# Patient Record
Sex: Female | Born: 1985 | Hispanic: Yes | Marital: Married | Smoking: Never smoker
Health system: Southern US, Community
[De-identification: ages and names within clinical notes are randomized; demographics above are authoritative.]

## PROBLEM LIST (undated history)

## (undated) ENCOUNTER — Inpatient Hospital Stay (HOSPITAL_COMMUNITY): Payer: Self-pay

## (undated) DIAGNOSIS — Z9189 Other specified personal risk factors, not elsewhere classified: Secondary | ICD-10-CM

## (undated) DIAGNOSIS — Z8619 Personal history of other infectious and parasitic diseases: Secondary | ICD-10-CM

## (undated) DIAGNOSIS — G43909 Migraine, unspecified, not intractable, without status migrainosus: Secondary | ICD-10-CM

## (undated) HISTORY — DX: Other specified personal risk factors, not elsewhere classified: Z91.89

## (undated) HISTORY — DX: Personal history of other infectious and parasitic diseases: Z86.19

## (undated) HISTORY — DX: Migraine, unspecified, not intractable, without status migrainosus: G43.909

---

## 2007-10-30 ENCOUNTER — Ambulatory Visit: Payer: Self-pay | Admitting: Family Medicine

## 2008-03-26 ENCOUNTER — Emergency Department: Payer: Self-pay | Admitting: Emergency Medicine

## 2008-07-07 ENCOUNTER — Emergency Department: Payer: Self-pay | Admitting: Emergency Medicine

## 2008-08-07 ENCOUNTER — Emergency Department: Payer: Self-pay | Admitting: Emergency Medicine

## 2009-10-15 ENCOUNTER — Emergency Department: Payer: Self-pay | Admitting: Emergency Medicine

## 2011-11-16 LAB — OB RESULTS CONSOLE HEPATITIS B SURFACE ANTIGEN: Hepatitis B Surface Ag: NEGATIVE

## 2011-11-16 LAB — OB RESULTS CONSOLE RPR: RPR: NONREACTIVE

## 2011-11-16 LAB — OB RESULTS CONSOLE ANTIBODY SCREEN: Antibody Screen: NEGATIVE

## 2011-11-16 LAB — OB RESULTS CONSOLE RUBELLA ANTIBODY, IGM: Rubella: IMMUNE

## 2011-11-16 LAB — OB RESULTS CONSOLE HIV ANTIBODY (ROUTINE TESTING): HIV: NONREACTIVE

## 2011-11-23 ENCOUNTER — Inpatient Hospital Stay (HOSPITAL_COMMUNITY)
Admission: AD | Admit: 2011-11-23 | Discharge: 2011-11-24 | Disposition: A | Discharge status: Hospice | Payer: BC Managed Care – PPO | Source: Ambulatory Visit | Attending: Obstetrics & Gynecology | Admitting: Obstetrics & Gynecology

## 2011-11-23 ENCOUNTER — Inpatient Hospital Stay (HOSPITAL_COMMUNITY): Payer: BC Managed Care – PPO

## 2011-11-23 ENCOUNTER — Encounter (HOSPITAL_COMMUNITY): Payer: Self-pay | Admitting: *Deleted

## 2011-11-23 DIAGNOSIS — O209 Hemorrhage in early pregnancy, unspecified: Secondary | ICD-10-CM

## 2011-11-23 LAB — URINALYSIS, ROUTINE W REFLEX MICROSCOPIC
Bilirubin Urine: NEGATIVE
Glucose, UA: NEGATIVE mg/dL
Ketones, ur: NEGATIVE mg/dL
Leukocytes, UA: NEGATIVE
Nitrite: NEGATIVE
Protein, ur: NEGATIVE mg/dL
Specific Gravity, Urine: <1.005 — ABNORMAL LOW (ref 1.005–1.030)
Urobilinogen, UA: 0.2 mg/dL (ref 0.0–1.0)
pH: 5.5 (ref 5.0–8.0)

## 2011-11-23 LAB — CBC
HCT: 35.6 % — ABNORMAL LOW (ref 36.0–46.0)
Hemoglobin: 12 g/dL (ref 12.0–15.0)
MCH: 28.6 pg (ref 26.0–34.0)
MCHC: 33.7 g/dL (ref 30.0–36.0)
MCV: 85 fL (ref 78.0–100.0)
Platelets: 178 10*3/uL (ref 150–400)
RBC: 4.19 MIL/uL (ref 3.87–5.11)
RDW: 12.9 % (ref 11.5–15.5)
WBC: 11.8 10*3/uL — ABNORMAL HIGH (ref 4.0–10.5)

## 2011-11-23 LAB — URINE MICROSCOPIC-ADD ON

## 2011-11-23 LAB — POCT PREGNANCY, URINE: Preg Test, Ur: POSITIVE — AB

## 2011-11-23 NOTE — MAU Note (Signed)
Pt arrived via EMS for bleeding in early pregnancy.  Pt passed bright red blood in the toilet with a small blood clot.  Pt denies pain.  Pt G2 P1 EDC 07/13/2012, prenatal care in [place].

## 2011-11-23 NOTE — MAU Provider Note (Signed)
History   Pt presents today c/o vag bleeding. She states she started bleeding heavily today with blood clots. She denies abd pain, vag irritation, or any other sx. Her last episode of intercourse was 1wk ago.  CSN: [id number]  Arrival date and time: 11/23/11 2247   First Provider Initiated Contact with Patient 11/23/11 2328      Chief Complaint  Patient presents with  . Vaginal Bleeding   HPI  OB History    Grav Para Term Preterm Abortions TAB SAB Ect Mult Living   2 1 1       1       Past Medical History  Diagnosis Date  . No pertinent past medical history     Past Surgical History  Procedure Date  . Cesarean section     History reviewed. No pertinent family history.  History  Substance Use Topics  . Smoking status: Never Smoker   . Smokeless tobacco: Not on file  . Alcohol Use: No    Allergies:  Allergies  Allergen Reactions  . Amoxicillin Rash    Pt states she is not allergic to penicillin or any other antibiotic    Prescriptions prior to admission  Medication Sig Dispense Refill  . FOLIC ACID PO Take 1 tablet by mouth every morning.      . ondansetron (ZOFRAN-ODT) 4 MG disintegrating tablet Take 4 mg by mouth every 8 (eight) hours as needed. For nausea      . Prenatal Vit-Fe Fumarate-FA (PRENATAL MULTIVITAMIN) TABS Take 1 tablet by mouth every morning.        Review of Systems  Constitutional: Negative for fever and chills.  Eyes: Negative for blurred vision and double vision.  Respiratory: Negative for cough.   Cardiovascular: Negative for chest pain and palpitations.  Gastrointestinal: Negative for nausea, vomiting, abdominal pain, diarrhea and constipation.  Genitourinary: Negative for dysuria, urgency, frequency and hematuria.  Neurological: Negative for dizziness and headaches.  Psychiatric/Behavioral: Negative for depression and suicidal ideas.   Physical Exam   Blood pressure 115/69, pulse 93, temperature 98.7 F (37.1 C), temperature  source Oral, resp. rate 16.  Physical Exam  Nursing note and vitals reviewed. Constitutional: She is oriented to person, place, and time. She appears well-developed and well-nourished. No distress.  HENT:  Head: Normocephalic and atraumatic.  Eyes: EOM are normal. Pupils are equal, round, and reactive to light.  GI: Soft. She exhibits no distension and no mass. There is no tenderness. There is no rebound and no guarding.  Genitourinary: There is bleeding around the vagina. No vaginal discharge found.       Minimal amount of dark vag bleeding noted on exam. Cervix Lg/closed. Uterus difficult to palpate secondary to pt discomfort with digital exam. No adnexal masses.  Neurological: She is alert and oriented to person, place, and time.  Skin: Skin is warm and dry. She is not diaphoretic.  Psychiatric: She has a normal mood and affect. Her behavior is normal. Judgment and thought content normal.    MAU Course  Procedures  Wet prep and GC/Chlamydia cultures done.  Results for orders placed during the hospital encounter of 11/23/11 (from the past 24 hour(s))  URINALYSIS, ROUTINE W REFLEX MICROSCOPIC     Status: Abnormal   Collection Time   11/23/11 11:20 PM      Component Value Range   Color, Urine YELLOW  YELLOW    APPearance CLEAR  CLEAR    Specific Gravity, Urine <1.005 (*) 1.005 - 1.030  pH 5.5  5.0 - 8.0    Glucose, UA NEGATIVE  NEGATIVE (mg/dL)   Hgb urine dipstick LARGE (*) NEGATIVE    Bilirubin Urine NEGATIVE  NEGATIVE    Ketones, ur NEGATIVE  NEGATIVE (mg/dL)   Protein, ur NEGATIVE  NEGATIVE (mg/dL)   Urobilinogen, UA 0.2  0.0 - 1.0 (mg/dL)   Nitrite NEGATIVE  NEGATIVE    Leukocytes, UA NEGATIVE  NEGATIVE   POCT PREGNANCY, URINE     Status: Abnormal   Collection Time   11/23/11 11:20 PM      Component Value Range   Preg Test, Ur POSITIVE (*) NEGATIVE   URINE MICROSCOPIC-ADD ON     Status: Abnormal   Collection Time   11/23/11 11:20 PM      Component Value Range    Squamous Epithelial / LPF FEW (*) RARE    RBC / HPF 0-2  <3 (RBC/hpf)   Bacteria, UA RARE  RARE   ABO/RH     Status: Normal (Preliminary result)   Collection Time   11/23/11 11:28 PM      Component Value Range   ABO/RH(D) O POS    CBC     Status: Abnormal   Collection Time   11/23/11 11:30 PM      Component Value Range   WBC 11.8 (*) 4.0 - 10.5 (K/uL)   RBC 4.19  3.87 - 5.11 (MIL/uL)   Hemoglobin 12.0  12.0 - 15.0 (g/dL)   HCT 16.1 (*) 09.6 - 46.0 (%)   MCV 85.0  78.0 - 100.0 (fL)   MCH 28.6  26.0 - 34.0 (pg)   MCHC 33.7  30.0 - 36.0 (g/dL)   RDW 04.5  40.9 - 81.1 (%)   Platelets 178  150 - 400 (K/uL)  WET PREP, GENITAL     Status: Abnormal   Collection Time   11/23/11 11:30 PM      Component Value Range   Yeast Wet Prep HPF POC NONE SEEN  NONE SEEN    Trich, Wet Prep NONE SEEN  NONE SEEN    Clue Cells Wet Prep HPF POC NONE SEEN  NONE SEEN    WBC, Wet Prep HPF POC FEW (*) NONE SEEN    US shows single IUP with good cardiac activity with EGA of 7.0wks.  Assessment and Plan  Bleeding in preg: discussed with pt at length. Discussed diet, activity, risks, and precautions.  Clinton Gallant. Jessie Schrieber III, DrHSc, MPAS, PA-C  11/23/2011, 11:35 PM

## 2011-11-24 LAB — HCG, QUANTITATIVE, PREGNANCY: hCG, Beta Chain, Quant, S: 62989 m[IU]/mL — ABNORMAL HIGH (ref <5)

## 2011-11-24 LAB — ABO/RH: ABO/RH(D): O POS

## 2011-11-24 LAB — WET PREP, GENITAL
Clue Cells Wet Prep HPF POC: NONE SEEN
Trich, Wet Prep: NONE SEEN
Yeast Wet Prep HPF POC: NONE SEEN

## 2011-11-24 LAB — GC/CHLAMYDIA PROBE AMP, GENITAL
Chlamydia, DNA Probe: NEGATIVE
GC Probe Amp, Genital: NEGATIVE

## 2011-11-24 NOTE — MAU Note (Signed)
Jenean Lindau, PA at bedside.  [place] results discussed with pt.

## 2011-11-24 NOTE — Discharge Instructions (Signed)
Vaginal Bleeding During Pregnancy, First Trimester  A small amount of bleeding (spotting) is relatively common in early pregnancy. It usually stops on its own. There are many causes for bleeding or spotting in early pregnancy. Some bleeding may be related to the pregnancy and some may not. Cramping with the bleeding is more serious and concerning. Tell your caregiver if you have any vaginal bleeding.   CAUSES    It is normal in most cases.   The pregnancy ends (miscarriage).   The pregnancy may end (threatened miscarriage).   Infection or inflammation of the cervix.   Growths (polyps) on the cervix.   Pregnancy happens outside of the uterus and in a fallopian tube (tubal pregnancy).   Many tiny cysts in the uterus instead of pregnancy tissue (molar pregnancy).  SYMPTOMS   Vaginal bleeding or spotting with or without cramps.  DIAGNOSIS   To evaluate the pregnancy, your caregiver may:   Do a pelvic exam.   Take blood tests.   Do an ultrasound.  It is very important to follow your caregiver's instructions.   TREATMENT    Evaluation of the pregnancy with blood tests and ultrasound.   Bed rest (getting up to use the bathroom only).   Rho-gam immunization if the mother is Rh negative and the father is Rh positive.  HOME CARE INSTRUCTIONS    If your caregiver orders bed rest, you may need to make arrangements for the care of other children and for other responsibilities. However, your caregiver may allow you to continue light activity.   Keep track of the number of pads you use each day, how often you change pads and how soaked (saturated) they are. Write this down.   Do not use tampons. Do not douche.   Do not have sexual intercourse or orgasms until approved by your physician.   Save any tissue that you pass for your caregiver to see.   Take medicine for cramps only with your caregiver's permission.   Do not take aspirin because it can make you bleed.  SEEK IMMEDIATE MEDICAL CARE IF:    You  experience severe cramps in your stomach, back or belly (abdomen).   You have an oral temperature above 102 F (38.9 C), not controlled by medicine.   You pass large clots or tissue.   Your bleeding increases or you become light-headed, weak or have fainting episodes.   You develop chills.   You are leaking or have a gush of fluid from your vagina.   You pass out while having a bowel movement. That may mean you have a ruptured tubal pregnancy.  Document Released: 05/27/2005 Document Revised: 08/06/2011 Document Reviewed: 12/06/2008  [employer] Patient Information 2012 [employer].

## 2011-11-26 NOTE — MAU Provider Note (Signed)
Viable iup  Medical Screening exam and patient care preformed by advanced practice provider.  Agree with the above management.

## 2011-12-13 ENCOUNTER — Emergency Department: Payer: Self-pay | Admitting: Emergency Medicine

## 2011-12-13 ENCOUNTER — Encounter (HOSPITAL_COMMUNITY): Payer: Self-pay | Admitting: *Deleted

## 2011-12-13 ENCOUNTER — Emergency Department (HOSPITAL_COMMUNITY)
Admission: EM | Admit: 2011-12-13 | Discharge: 2011-12-14 | Disposition: A | Discharge status: Home / Self Care | Payer: BC Managed Care – PPO | Attending: Emergency Medicine | Admitting: Emergency Medicine

## 2011-12-13 DIAGNOSIS — R059 Cough, unspecified: Secondary | ICD-10-CM | POA: Insufficient documentation

## 2011-12-13 DIAGNOSIS — R42 Dizziness and giddiness: Secondary | ICD-10-CM | POA: Insufficient documentation

## 2011-12-13 DIAGNOSIS — R5381 Other malaise: Secondary | ICD-10-CM | POA: Insufficient documentation

## 2011-12-13 DIAGNOSIS — E871 Hypo-osmolality and hyponatremia: Secondary | ICD-10-CM | POA: Insufficient documentation

## 2011-12-13 DIAGNOSIS — R51 Headache: Secondary | ICD-10-CM | POA: Insufficient documentation

## 2011-12-13 DIAGNOSIS — J329 Chronic sinusitis, unspecified: Secondary | ICD-10-CM | POA: Insufficient documentation

## 2011-12-13 DIAGNOSIS — O210 Mild hyperemesis gravidarum: Secondary | ICD-10-CM | POA: Insufficient documentation

## 2011-12-13 DIAGNOSIS — R05 Cough: Secondary | ICD-10-CM | POA: Insufficient documentation

## 2011-12-13 DIAGNOSIS — R509 Fever, unspecified: Secondary | ICD-10-CM | POA: Insufficient documentation

## 2011-12-13 DIAGNOSIS — J3489 Other specified disorders of nose and nasal sinuses: Secondary | ICD-10-CM | POA: Insufficient documentation

## 2011-12-13 DIAGNOSIS — R5383 Other fatigue: Secondary | ICD-10-CM | POA: Insufficient documentation

## 2011-12-13 DIAGNOSIS — R1110 Vomiting, unspecified: Secondary | ICD-10-CM

## 2011-12-13 LAB — DIFFERENTIAL
Basophils Absolute: 0 10*3/uL (ref 0.0–0.1)
Basophils Relative: 0 % (ref 0–1)
Eosinophils Absolute: 0.1 10*3/uL (ref 0.0–0.7)
Eosinophils Relative: 1 % (ref 0–5)
Lymphocytes Relative: 9 % — ABNORMAL LOW (ref 12–46)
Lymphs Abs: 1.3 10*3/uL (ref 0.7–4.0)
Monocytes Absolute: 1.1 10*3/uL — ABNORMAL HIGH (ref 0.1–1.0)
Monocytes Relative: 8 % (ref 3–12)
Neutro Abs: 11.6 10*3/uL — ABNORMAL HIGH (ref 1.7–7.7)
Neutrophils Relative %: 82 % — ABNORMAL HIGH (ref 43–77)

## 2011-12-13 LAB — COMPREHENSIVE METABOLIC PANEL
ALT: 12 U/L (ref 0–35)
AST: 19 U/L (ref 0–37)
Albumin: 3.7 g/dL (ref 3.5–5.2)
Alkaline Phosphatase: 72 U/L (ref 39–117)
BUN: 6 mg/dL (ref 6–23)
CO2: 21 mEq/L (ref 19–32)
Calcium: 9.8 mg/dL (ref 8.4–10.5)
Chloride: 94 mEq/L — ABNORMAL LOW (ref 96–112)
Creatinine, Ser: 0.54 mg/dL (ref 0.50–1.10)
GFR calc Af Amer: >90 mL/min (ref >90)
GFR calc non Af Amer: >90 mL/min (ref >90)
Glucose, Bld: 87 mg/dL (ref 70–99)
Potassium: 3.8 mEq/L (ref 3.5–5.1)
Sodium: 129 mEq/L — ABNORMAL LOW (ref 135–145)
Total Bilirubin: 0.4 mg/dL (ref 0.3–1.2)
Total Protein: 7.7 g/dL (ref 6.0–8.3)

## 2011-12-13 LAB — URINALYSIS, ROUTINE W REFLEX MICROSCOPIC
Bilirubin Urine: NEGATIVE
Glucose, UA: NEGATIVE mg/dL
Hgb urine dipstick: NEGATIVE
Ketones, ur: >80 mg/dL — AB
Leukocytes, UA: NEGATIVE
Nitrite: NEGATIVE
Protein, ur: NEGATIVE mg/dL
Specific Gravity, Urine: 1.013 (ref 1.005–1.030)
Urobilinogen, UA: 1 mg/dL (ref 0.0–1.0)
pH: 6.5 (ref 5.0–8.0)

## 2011-12-13 LAB — CBC
HCT: 36.3 % (ref 36.0–46.0)
Hemoglobin: 12.5 g/dL (ref 12.0–15.0)
MCH: 29.2 pg (ref 26.0–34.0)
MCHC: 34.4 g/dL (ref 30.0–36.0)
MCV: 84.8 fL (ref 78.0–100.0)
Platelets: 217 10*3/uL (ref 150–400)
RBC: 4.28 MIL/uL (ref 3.87–5.11)
RDW: 12.5 % (ref 11.5–15.5)
WBC: 14.1 10*3/uL — ABNORMAL HIGH (ref 4.0–10.5)

## 2011-12-13 MED ORDER — MORPHINE SULFATE 4 MG/ML IJ SOLN
4.0000 mg | Freq: Once | INTRAMUSCULAR | Status: AC
  Administered 2011-12-13: 4 mg via INTRAVENOUS
  Filled 2011-12-13: qty 1

## 2011-12-13 MED ORDER — ONDANSETRON HCL 4 MG/2ML IJ SOLN
4.0000 mg | Freq: Once | INTRAMUSCULAR | Status: AC
  Administered 2011-12-13: 4 mg via INTRAVENOUS
  Filled 2011-12-13: qty 2

## 2011-12-13 MED ORDER — DEXTROSE 5 % IV SOLN
1.0000 g | Freq: Once | INTRAVENOUS | Status: AC
  Administered 2011-12-14: 1 g via INTRAVENOUS
  Filled 2011-12-13: qty 10

## 2011-12-13 MED ORDER — HYDROCODONE-ACETAMINOPHEN 5-325 MG PO TABS
1.0000 | ORAL_TABLET | Freq: Once | ORAL | Status: AC
  Administered 2011-12-14: 1 via ORAL
  Filled 2011-12-13: qty 1

## 2011-12-13 MED ORDER — PROMETHAZINE HCL 25 MG/ML IJ SOLN
25.0000 mg | Freq: Once | INTRAMUSCULAR | Status: DC
  Filled 2011-12-13: qty 1

## 2011-12-13 NOTE — ED Notes (Signed)
Pt reports sinus congestion, headache and nausea. Pt is nine weeks pregnant and has been vomiting for the past few days. Pt has tried Zofran without relief.  Pt has been unable to keep fluids down.  Pt reports feeling dizziness and weakness.  Pt is seen by MD Hilda Blades.  Pt has appointment for April 30th to see OBGYN.  Pt had a threatened miscarriage three to four weeks ago.

## 2011-12-14 MED ORDER — CEPHALEXIN 500 MG PO CAPS: 500.0000 mg | ORAL_CAPSULE | Freq: Four times a day (QID) | ORAL | Status: AC

## 2011-12-14 MED ORDER — HYDROCODONE-ACETAMINOPHEN 5-325 MG PO TABS: 1.0000 | ORAL_TABLET | ORAL | Status: AC | PRN

## 2011-12-14 MED ORDER — PROMETHAZINE HCL 25 MG PO TABS: 25.0000 mg | ORAL_TABLET | Freq: Four times a day (QID) | ORAL | Status: DC | PRN

## 2011-12-14 NOTE — ED Notes (Signed)
PA Sanford made aware that pt was able to drink 8oz of ginger-ale and eat without N/V.

## 2011-12-14 NOTE — ED Notes (Signed)
After receiving morphine IVP pt denied nausea.

## 2011-12-14 NOTE — ED Provider Notes (Signed)
History     CSN: [id number]  Arrival date & time 12/13/11  1953   First MD Initiated Contact with Patient 12/13/11 2129      Chief Complaint  Patient presents with  . Nausea  . Emesis During Pregnancy  . Headache  . Dizziness  . Weakness  . Nasal Congestion    (Consider location/radiation/quality/duration/timing/severity/associated sxs/prior treatment) HPI Comments: Patient who is [redacted] weeks pregnant presents with nausea and vomiting since she knew she was pregnant, usually controlled at home with her zofran but reports that she developed a sinus infection 2 days ago - she is here because the pain is worse than any she has ever had, she has been only able to take delsym for the symptoms because of the pregnancy and this has made the nausea and vomiting worse.  She reports fever but no chills, facial pain with radiation into her gums, denies chest pain but reports cough.  She deneis abdominal pain, vaginal bleeding or discharge.  Patient is a 26 y.o. female presenting with headaches and weakness. The history is provided by the patient and the spouse. No language interpreter was used.  Headache  This is a new problem. The current episode started 2 days ago. The problem occurs constantly. The problem has not changed since onset.The headache is associated with eating and coughing. The pain is located in the frontal and temporal region. The quality of the pain is described as throbbing. The pain is at a severity of 10/10. The pain is severe. The pain radiates to the face. Associated symptoms include anorexia, a fever, malaise/fatigue, nausea and vomiting. Pertinent negatives include no chest pressure, no near-syncope, no orthopnea, no palpitations, no syncope and no shortness of breath. She has tried NSAIDs for the symptoms. The treatment provided no relief.  Weakness The primary symptoms include headaches, fever, nausea and vomiting. Primary symptoms do not include syncope.  The headache is  associated with weakness.  Additional symptoms include weakness.    Past Medical History  Diagnosis Date  . No pertinent past medical history     Past Surgical History  Procedure Date  . Cesarean section     No family history on file.  History  Substance Use Topics  . Smoking status: Never Smoker   . Smokeless tobacco: Not on file  . Alcohol Use: No    OB History    Grav Para Term Preterm Abortions TAB SAB Ect Mult Living   2 1 1       1       Review of Systems  Constitutional: Positive for fever and malaise/fatigue. Negative for chills.  HENT: Positive for rhinorrhea and sinus pressure. Negative for drooling and dental problem.   Respiratory: Negative for shortness of breath.   Cardiovascular: Negative for palpitations, orthopnea, syncope and near-syncope.  Gastrointestinal: Positive for nausea, vomiting and anorexia.  Neurological: Positive for weakness and headaches.  All other systems reviewed and are negative.    Allergies  Amoxicillin  Home Medications   Current Outpatient Rx  Name Route Sig Dispense Refill  . DEXTROMETHORPHAN POLISTIREX ER 30 MG/5ML PO LQCR Oral Take 60 mg by mouth 2 (two) times daily.    Marland Kitchen FOLIC ACID PO Oral Take 1 tablet by mouth every morning.    Marland Kitchen MENTHOL 3 MG MT LOZG Oral Take 1 lozenge by mouth as needed. For sore throat    . ONDANSETRON 4 MG PO TBDP Oral Take 4 mg by mouth every 8 (eight) hours as needed. For  nausea      BP 116/61  Pulse 97  Temp(Src) 99.2 F (37.3 C) (Oral)  Resp 15  Ht 5\' 7"  (1.702 m)  Wt 153 lb 6.4 oz (69.582 kg)  BMI 24.03 kg/m2  SpO2 98%  Physical Exam  Nursing note and vitals reviewed. Constitutional: She is oriented to person, place, and time. She appears well-developed and well-nourished. No distress.  HENT:  Head: Normocephalic and atraumatic.  Right Ear: External ear normal.  Left Ear: External ear normal.  Mouth/Throat: Oropharynx is clear and moist. No oropharyngeal exudate.       Boggy  nasal mucosa, frontal and maxillary sinus tenderness to palpation  Eyes: Conjunctivae are normal. Pupils are equal, round, and reactive to light. No scleral icterus.  Neck: Normal range of motion. Neck supple.  Cardiovascular: Normal rate, regular rhythm and normal heart sounds.  Exam reveals no gallop and no friction rub.   No murmur heard. Pulmonary/Chest: Effort normal and breath sounds normal. No respiratory distress. She has no wheezes. She has no rales. She exhibits no tenderness.  Abdominal: Soft. Bowel sounds are normal. She exhibits no distension. There is no tenderness.  Musculoskeletal: Normal range of motion. She exhibits no edema and no tenderness.  Lymphadenopathy:    She has no cervical adenopathy.  Neurological: She is alert and oriented to person, place, and time. No cranial nerve deficit.  Skin: Skin is warm and dry. No rash noted. No erythema. No pallor.  Psychiatric: She has a normal mood and affect. Her behavior is normal. Judgment and thought content normal.    ED Course  Procedures (including critical care time)  Labs Reviewed  COMPREHENSIVE METABOLIC PANEL - Abnormal; Notable for the following:    Sodium 129 (*)    Chloride 94 (*)    All other components within normal limits  CBC - Abnormal; Notable for the following:    WBC 14.1 (*)    All other components within normal limits  URINALYSIS, ROUTINE W REFLEX MICROSCOPIC - Abnormal; Notable for the following:    Ketones, ur >80 (*)    All other components within normal limits  DIFFERENTIAL - Abnormal; Notable for the following:    Neutrophils Relative 82 (*)    Lymphocytes Relative 9 (*)    Neutro Abs 11.6 (*)    Monocytes Absolute 1.1 (*)    All other components within normal limits   No results found.  Results for orders placed during the hospital encounter of 12/13/11  COMPREHENSIVE METABOLIC PANEL      Component Value Range   Sodium 129 (*) 135 - 145 (mEq/L)   Potassium 3.8  3.5 - 5.1 (mEq/L)    Chloride 94 (*) 96 - 112 (mEq/L)   CO2 21  19 - 32 (mEq/L)   Glucose, Bld 87  70 - 99 (mg/dL)   BUN 6  6 - 23 (mg/dL)   Creatinine, Ser 2.95  0.50 - 1.10 (mg/dL)   Calcium 9.8  8.4 - 62.1 (mg/dL)   Total Protein 7.7  6.0 - 8.3 (g/dL)   Albumin 3.7  3.5 - 5.2 (g/dL)   AST 19  0 - 37 (U/L)   ALT 12  0 - 35 (U/L)   Alkaline Phosphatase 72  39 - 117 (U/L)   Total Bilirubin 0.4  0.3 - 1.2 (mg/dL)   GFR calc non Af Amer >90  >90 (mL/min)   GFR calc Af Amer >90  >90 (mL/min)  CBC      Component Value  Range   WBC 14.1 (*) 4.0 - 10.5 (K/uL)   RBC 4.28  3.87 - 5.11 (MIL/uL)   Hemoglobin 12.5  12.0 - 15.0 (g/dL)   HCT 16.1  09.6 - 04.5 (%)   MCV 84.8  78.0 - 100.0 (fL)   MCH 29.2  26.0 - 34.0 (pg)   MCHC 34.4  30.0 - 36.0 (g/dL)   RDW 40.9  81.1 - 91.4 (%)   Platelets 217  150 - 400 (K/uL)  URINALYSIS, ROUTINE W REFLEX MICROSCOPIC      Component Value Range   Color, Urine YELLOW  YELLOW    APPearance CLEAR  CLEAR    Specific Gravity, Urine 1.013  1.005 - 1.030    pH 6.5  5.0 - 8.0    Glucose, UA NEGATIVE  NEGATIVE (mg/dL)   Hgb urine dipstick NEGATIVE  NEGATIVE    Bilirubin Urine NEGATIVE  NEGATIVE    Ketones, ur >80 (*) NEGATIVE (mg/dL)   Protein, ur NEGATIVE  NEGATIVE (mg/dL)   Urobilinogen, UA 1.0  0.0 - 1.0 (mg/dL)   Nitrite NEGATIVE  NEGATIVE    Leukocytes, UA NEGATIVE  NEGATIVE   DIFFERENTIAL      Component Value Range   Neutrophils Relative 82 (*) 43 - 77 (%)   Lymphocytes Relative 9 (*) 12 - 46 (%)   Monocytes Relative 8  3 - 12 (%)   Eosinophils Relative 1  0 - 5 (%)   Basophils Relative 0  0 - 1 (%)   Neutro Abs 11.6 (*) 1.7 - 7.7 (K/uL)   Lymphs Abs 1.3  0.7 - 4.0 (K/uL)   Monocytes Absolute 1.1 (*) 0.1 - 1.0 (K/uL)   Eosinophils Absolute 0.1  0.0 - 0.7 (K/uL)   Basophils Absolute 0.0  0.0 - 0.1 (K/uL)   Smear Review MORPHOLOGY UNREMARKABLE     US Ob Comp Less 14 Wks  11/24/2011  *RADIOLOGY REPORT*  Clinical Data: Vaginal bleeding.  OBSTETRIC <14 WK [place] AND  TRANSVAGINAL OB US  Technique:  Both transabdominal and transvaginal ultrasound examinations were performed for complete evaluation of the gestation as well as the maternal uterus, adnexal regions, and pelvic cul-de-sac.  Transvaginal technique was performed to assess early pregnancy.  Comparison:  None.  Intrauterine gestational sac:  Visualized/normal in shape. Yolk sac: No Embryo: Yes Cardiac Activity: Yes Heart Rate: 146 bpm  CRL: 9.0   mm  7   w  0   d         [place] EDC: 07/11/2012  Maternal uterus/adnexae: No subchorionic hemorrhage is seen.  The uterus is unremarkable in appearance.  The ovaries are not visualized; the patient could not tolerate the transvaginal ultrasound, making evaluation of the adnexa suboptimal.  The adnexa are grossly unremarkable in appearance.  No free fluid is seen within the pelvic cul-de-sac.  IMPRESSION: Single live intrauterine pregnancy noted, with a crown-rump length of 9 mm, corresponding to a gestational age of [redacted] weeks 0 days. This matches the gestational age of [redacted] weeks 5 days by LMP, reflecting an estimated date of delivery of July 13, 2012.  Original Report Authenticated By: Tonia Ghent, M.D.   US Ob Transvaginal  11/24/2011  *RADIOLOGY REPORT*  Clinical Data: Vaginal bleeding.  OBSTETRIC <14 WK [place] AND TRANSVAGINAL OB US  Technique:  Both transabdominal and transvaginal ultrasound examinations were performed for complete evaluation of the gestation as well as the maternal uterus, adnexal regions, and pelvic cul-de-sac.  Transvaginal technique was performed to assess early pregnancy.  Comparison:  None.  Intrauterine gestational sac:  Visualized/normal in shape. Yolk sac: No Embryo: Yes Cardiac Activity: Yes Heart Rate: 146 bpm  CRL: 9.0   mm  7   w  0   d         [place] EDC: 07/11/2012  Maternal uterus/adnexae: No subchorionic hemorrhage is seen.  The uterus is unremarkable in appearance.  The ovaries are not visualized; the patient could not tolerate the transvaginal  ultrasound, making evaluation of the adnexa suboptimal.  The adnexa are grossly unremarkable in appearance.  No free fluid is seen within the pelvic cul-de-sac.  IMPRESSION: Single live intrauterine pregnancy noted, with a crown-rump length of 9 mm, corresponding to a gestational age of [redacted] weeks 0 days. This matches the gestational age of [redacted] weeks 5 days by LMP, reflecting an estimated date of delivery of July 13, 2012.  Original Report Authenticated By: Tonia Ghent, M.D.     Vomiting Sinus infection    MDM  Patient here is [redacted] weeks pregnant presents with worsening nausea and vomiting after getting sinusitis. Given fluids here and pain control is able to keep down by mouth fluids. Noted to be hyponatremic, instructed to increase salt intake. Will start on antibiotics and short course of narcotic pain medication. She will followup with OB this week.        Izola Price [place], [place] 12/14/11 [phone]

## 2011-12-14 NOTE — Discharge Instructions (Signed)
Hyponatremia  Hyponatremia is when the amount of salt (sodium) in your blood is too low. When sodium levels are low, your cells will absorb extra water and swell. The swelling happens throughout the body, but it mostly affects the brain. Severe brain swelling (cerebral edema), seizures, or coma can happen.  CAUSES   Heart, kidney, or liver problems.   Thyroid problems.   Adrenal gland problems.   Severe vomiting and diarrhea.   Certain medicines or illegal drugs.   Dehydration.   Drinking too much water.   Low-sodium diet.  SYMPTOMS   Nausea and vomiting.   Confusion.   Lethargy.   Agitation.   Headache.   Twitching or shaking (seizures).   Unconsciousness.   Appetite loss.   Muscle weakness and cramping.  DIAGNOSIS  Hyponatremia is identified by a simple blood test. Your caregiver will perform a history and physical exam to try to find the cause and type of hyponatremia. Other tests may be needed to measure the amount of sodium in your blood and urine. TREATMENT  Treatment will depend on the cause.   Fluids may be given through the vein (IV).   Medicines may be used to correct the sodium imbalance. If medicines are causing the problem, they will need to be adjusted.   Water or fluid intake may be restricted to restore proper balance.  The speed of correcting the sodium problem is very important. If the problem is corrected too fast, nerve damage (sometimes unchangeable) can happen. HOME CARE INSTRUCTIONS   Only take medicines as directed by your caregiver. Many medicines can make hyponatremia worse. Discuss all your medicines with your caregiver.   Carefully follow any recommended diet, including any fluid restrictions.   You may be asked to repeat lab tests. Follow these directions.   Avoid alcohol and recreational drugs.  SEEK MEDICAL CARE IF:   You develop worsening nausea, fatigue, headache, confusion, or weakness.   Your original hyponatremia  symptoms return.   You have problems following the recommended diet.  SEEK IMMEDIATE MEDICAL CARE IF:   You have a seizure.   You faint.   You have ongoing diarrhea or vomiting.  MAKE SURE YOU:   Understand these instructions.   Will watch your condition.   Will get help right away if you are not doing well or get worse.  Document Released: 08/07/2002 Document Revised: 08/06/2011 Document Reviewed: 02/01/2011 [facility] Patient Information 2012 [place], [place].Sinusitis Sinuses are air pockets within the bones of your face. The growth of bacteria within a sinus leads to infection. The infection prevents the sinuses from draining. This infection is called sinusitis. SYMPTOMS  There will be different areas of pain depending on which sinuses have become infected.  The maxillary sinuses often produce pain beneath the eyes.   Frontal sinusitis may cause pain in the middle of the forehead and above the eyes.  Other problems (symptoms) include:  Toothaches.   Colored, pus-like (purulent) drainage from the nose.   Swelling, warmth, and tenderness over the sinus areas may be signs of infection.  TREATMENT  Sinusitis is most often determined by an exam.X-rays may be taken. If x-rays have been taken, make sure you obtain your results or find out how you are to obtain them. Your caregiver may give you medications (antibiotics). These are medications that will help kill the bacteria causing the infection. You may also be given a medication (decongestant) that helps to reduce sinus swelling.  HOME CARE INSTRUCTIONS   Only take over-the-counter  or prescription medicines for pain, discomfort, or fever as directed by your caregiver.   Drink extra fluids. Fluids help thin the mucus so your sinuses can drain more easily.   Applying either moist heat or ice packs to the sinus areas may help relieve discomfort.   Use saline nasal sprays to help moisten your sinuses. The sprays can be found at  your local drugstore.  SEEK IMMEDIATE MEDICAL CARE IF:  You have a fever.   You have increasing pain, severe headaches, or toothache.   You have nausea, vomiting, or drowsiness.   You develop unusual swelling around the face or trouble seeing.  MAKE SURE YOU:   Understand these instructions.   Will watch your condition.   Will get help right away if you are not doing well or get worse.  Document Released: 08/17/2005 Document Revised: 08/06/2011 Document Reviewed: 03/16/2007 [facility] Patient Information 2012 [place], [place].   Your sodium level was low here at 129. Increased her salt intake as much as possible. Take the antibiotic as directed and followup with your OB as soon as possible.

## 2011-12-17 NOTE — ED Provider Notes (Signed)
Medical screening examination/treatment/procedure(s) were performed by non-physician practitioner and as supervising physician I was immediately available for consultation/collaboration.  Lyvonne Cassell L Dinnis Rog, MD 12/17/11 0734 

## 2012-06-14 LAB — OB RESULTS CONSOLE GBS: GBS: POSITIVE

## 2012-07-10 ENCOUNTER — Encounter (HOSPITAL_COMMUNITY): Payer: Self-pay | Admitting: *Deleted

## 2012-07-10 ENCOUNTER — Encounter (HOSPITAL_COMMUNITY): Payer: Self-pay | Admitting: Anesthesiology

## 2012-07-10 ENCOUNTER — Inpatient Hospital Stay (HOSPITAL_COMMUNITY)
Admission: AD | Admit: 2012-07-10 | Discharge: 2012-07-13 | DRG: 766 | Disposition: A | Discharge status: Home / Self Care | Payer: 59 | Source: Ambulatory Visit | Attending: Obstetrics & Gynecology | Admitting: Obstetrics & Gynecology

## 2012-07-10 ENCOUNTER — Inpatient Hospital Stay (HOSPITAL_COMMUNITY): Payer: 59 | Admitting: Anesthesiology

## 2012-07-10 ENCOUNTER — Encounter (HOSPITAL_COMMUNITY)
Admission: AD | Disposition: A | Discharge status: Home / Self Care | Payer: Self-pay | Source: Ambulatory Visit | Attending: Obstetrics & Gynecology

## 2012-07-10 DIAGNOSIS — Z22330 Carrier of Group B streptococcus: Secondary | ICD-10-CM

## 2012-07-10 DIAGNOSIS — D649 Anemia, unspecified: Secondary | ICD-10-CM | POA: Diagnosis not present

## 2012-07-10 DIAGNOSIS — O34219 Maternal care for unspecified type scar from previous cesarean delivery: Secondary | ICD-10-CM | POA: Diagnosis present

## 2012-07-10 DIAGNOSIS — O323XX0 Maternal care for face, brow and chin presentation, not applicable or unspecified: Principal | ICD-10-CM | POA: Diagnosis present

## 2012-07-10 DIAGNOSIS — O99892 Other specified diseases and conditions complicating childbirth: Secondary | ICD-10-CM | POA: Diagnosis present

## 2012-07-10 DIAGNOSIS — O9903 Anemia complicating the puerperium: Secondary | ICD-10-CM | POA: Diagnosis not present

## 2012-07-10 LAB — TYPE AND SCREEN
ABO/RH(D): O POS
Antibody Screen: NEGATIVE
Unit division: 0
Unit division: 0

## 2012-07-10 LAB — CBC
HCT: 33.5 % — ABNORMAL LOW (ref 36.0–46.0)
Hemoglobin: 11 g/dL — ABNORMAL LOW (ref 12.0–15.0)
MCH: 26.2 pg (ref 26.0–34.0)
MCHC: 32.8 g/dL (ref 30.0–36.0)
MCV: 79.8 fL (ref 78.0–100.0)
Platelets: 156 10*3/uL (ref 150–400)
RBC: 4.2 MIL/uL (ref 3.87–5.11)
RDW: 14.5 % (ref 11.5–15.5)
WBC: 12.8 10*3/uL — ABNORMAL HIGH (ref 4.0–10.5)

## 2012-07-10 LAB — RPR: RPR Ser Ql: NONREACTIVE

## 2012-07-10 LAB — PREPARE RBC (CROSSMATCH)

## 2012-07-10 SURGERY — Surgical Case
Anesthesia: Epidural | Site: Abdomen | Wound class: Clean Contaminated

## 2012-07-10 MED ORDER — WITCH HAZEL-GLYCERIN EX PADS: 1.0000 "application " | MEDICATED_PAD | CUTANEOUS | Status: DC | PRN

## 2012-07-10 MED ORDER — MORPHINE SULFATE 0.5 MG/ML IJ SOLN
INTRAMUSCULAR | Status: AC
  Filled 2012-07-10: qty 10

## 2012-07-10 MED ORDER — OXYTOCIN 10 UNIT/ML IJ SOLN
INTRAMUSCULAR | Status: AC
  Filled 2012-07-10: qty 4

## 2012-07-10 MED ORDER — DIBUCAINE 1 % RE OINT: 1.0000 "application " | TOPICAL_OINTMENT | RECTAL | Status: DC | PRN

## 2012-07-10 MED ORDER — PHENYLEPHRINE 40 MCG/ML (10ML) SYRINGE FOR IV PUSH (FOR BLOOD PRESSURE SUPPORT)
80.0000 ug | PREFILLED_SYRINGE | INTRAVENOUS | Status: DC | PRN
  Filled 2012-07-10: qty 2
  Filled 2012-07-10: qty 5

## 2012-07-10 MED ORDER — ONDANSETRON HCL 4 MG/2ML IJ SOLN
4.0000 mg | INTRAMUSCULAR | Status: DC | PRN
Start: 2012-07-10 — End: 2012-07-13
  Administered 2012-07-10: 4 mg via INTRAVENOUS
  Filled 2012-07-10: qty 2

## 2012-07-10 MED ORDER — LACTATED RINGERS IV SOLN: 500.0000 mL | Freq: Once | INTRAVENOUS | Status: DC

## 2012-07-10 MED ORDER — KETOROLAC TROMETHAMINE 30 MG/ML IJ SOLN: 15.0000 mg | Freq: Once | INTRAMUSCULAR | Status: DC | PRN

## 2012-07-10 MED ORDER — NALBUPHINE HCL 10 MG/ML IJ SOLN
5.0000 mg | INTRAMUSCULAR | Status: DC | PRN
Start: 2012-07-10 — End: 2012-07-13
  Filled 2012-07-10: qty 1

## 2012-07-10 MED ORDER — MORPHINE SULFATE (PF) 0.5 MG/ML IJ SOLN
INTRAMUSCULAR | Status: DC | PRN
  Administered 2012-07-10: .1 mg via INTRATHECAL

## 2012-07-10 MED ORDER — NALOXONE HCL 0.4 MG/ML IJ SOLN: 1.0000 ug/kg/h | INTRAMUSCULAR | Status: DC | PRN

## 2012-07-10 MED ORDER — INFLUENZA VIRUS VACC SPLIT PF IM SUSP
0.5000 mL | INTRAMUSCULAR | Status: AC
  Administered 2012-07-12: 0.5 mL via INTRAMUSCULAR
  Filled 2012-07-10: qty 0.5

## 2012-07-10 MED ORDER — OXYCODONE-ACETAMINOPHEN 5-325 MG PO TABS
1.0000 | ORAL_TABLET | ORAL | Status: DC | PRN
  Administered 2012-07-11 – 2012-07-12 (×6): 1 via ORAL
  Administered 2012-07-13: 2 via ORAL
  Filled 2012-07-10 (×5): qty 1
  Filled 2012-07-10: qty 2
  Filled 2012-07-10: qty 1

## 2012-07-10 MED ORDER — DIPHENHYDRAMINE HCL 25 MG PO CAPS: 25.0000 mg | ORAL_CAPSULE | Freq: Four times a day (QID) | ORAL | Status: DC | PRN

## 2012-07-10 MED ORDER — IBUPROFEN 600 MG PO TABS: 600.0000 mg | ORAL_TABLET | Freq: Four times a day (QID) | ORAL | Status: DC | PRN

## 2012-07-10 MED ORDER — OXYCODONE-ACETAMINOPHEN 5-325 MG PO TABS: 1.0000 | ORAL_TABLET | ORAL | Status: DC | PRN

## 2012-07-10 MED ORDER — MIDAZOLAM HCL 5 MG/5ML IJ SOLN
INTRAMUSCULAR | Status: DC | PRN
  Administered 2012-07-10 (×2): 1 mg via INTRAVENOUS

## 2012-07-10 MED ORDER — CEFAZOLIN SODIUM 1-5 GM-% IV SOLN
INTRAVENOUS | Status: DC | PRN
  Administered 2012-07-10: 2 g via INTRAVENOUS

## 2012-07-10 MED ORDER — ONDANSETRON HCL 4 MG/2ML IJ SOLN: 4.0000 mg | Freq: Three times a day (TID) | INTRAMUSCULAR | Status: DC | PRN

## 2012-07-10 MED ORDER — LIDOCAINE HCL (PF) 1 % IJ SOLN
30.0000 mL | INTRAMUSCULAR | Status: DC | PRN
  Administered 2012-07-10: 30 mL via SUBCUTANEOUS
  Filled 2012-07-10: qty 30

## 2012-07-10 MED ORDER — PHENYLEPHRINE 40 MCG/ML (10ML) SYRINGE FOR IV PUSH (FOR BLOOD PRESSURE SUPPORT): 80.0000 ug | PREFILLED_SYRINGE | INTRAVENOUS | Status: DC | PRN

## 2012-07-10 MED ORDER — OXYTOCIN 40 UNITS IN LACTATED RINGERS INFUSION - SIMPLE MED: 62.5000 mL/h | INTRAVENOUS | Status: AC

## 2012-07-10 MED ORDER — LIDOCAINE HCL (PF) 1 % IJ SOLN
INTRAMUSCULAR | Status: DC | PRN
  Administered 2012-07-10 (×2): 9 mL

## 2012-07-10 MED ORDER — EPHEDRINE SULFATE 50 MG/ML IJ SOLN
INTRAMUSCULAR | Status: DC | PRN
  Administered 2012-07-10: 5 mg via INTRAVENOUS
  Administered 2012-07-10 (×2): 10 mg via INTRAVENOUS

## 2012-07-10 MED ORDER — PROMETHAZINE HCL 25 MG/ML IJ SOLN: 6.2500 mg | INTRAMUSCULAR | Status: DC | PRN

## 2012-07-10 MED ORDER — MENTHOL 3 MG MT LOZG: 1.0000 | LOZENGE | OROMUCOSAL | Status: DC | PRN

## 2012-07-10 MED ORDER — CEFAZOLIN SODIUM 1-5 GM-% IV SOLN
1.0000 g | Freq: Three times a day (TID) | INTRAVENOUS | Status: DC
  Filled 2012-07-10: qty 50

## 2012-07-10 MED ORDER — OXYTOCIN BOLUS FROM INFUSION: 500.0000 mL | INTRAVENOUS | Status: DC

## 2012-07-10 MED ORDER — PRENATAL MULTIVITAMIN CH
1.0000 | ORAL_TABLET | Freq: Every day | ORAL | Status: DC
  Administered 2012-07-11: 1 via ORAL
  Filled 2012-07-10 (×2): qty 1

## 2012-07-10 MED ORDER — SCOPOLAMINE 1 MG/3DAYS TD PT72
1.0000 | MEDICATED_PATCH | Freq: Once | TRANSDERMAL | Status: DC
  Administered 2012-07-10: 1.5 mg via TRANSDERMAL

## 2012-07-10 MED ORDER — NALBUPHINE HCL 10 MG/ML IJ SOLN
5.0000 mg | INTRAMUSCULAR | Status: DC | PRN
  Filled 2012-07-10: qty 1

## 2012-07-10 MED ORDER — DIPHENHYDRAMINE HCL 25 MG PO CAPS: 25.0000 mg | ORAL_CAPSULE | ORAL | Status: DC | PRN

## 2012-07-10 MED ORDER — MEPERIDINE HCL 25 MG/ML IJ SOLN: 6.2500 mg | INTRAMUSCULAR | Status: DC | PRN

## 2012-07-10 MED ORDER — DIPHENHYDRAMINE HCL 50 MG/ML IJ SOLN: 25.0000 mg | INTRAMUSCULAR | Status: DC | PRN

## 2012-07-10 MED ORDER — CEFAZOLIN SODIUM-DEXTROSE 2-3 GM-% IV SOLR
INTRAVENOUS | Status: AC
  Filled 2012-07-10: qty 50

## 2012-07-10 MED ORDER — LACTATED RINGERS IV SOLN: 500.0000 mL | INTRAVENOUS | Status: DC | PRN

## 2012-07-10 MED ORDER — OXYTOCIN 10 UNIT/ML IJ SOLN
40.0000 [IU] | INTRAVENOUS | Status: DC | PRN
  Administered 2012-07-10: 40 [IU] via INTRAVENOUS

## 2012-07-10 MED ORDER — KETOROLAC TROMETHAMINE 60 MG/2ML IM SOLN
60.0000 mg | Freq: Once | INTRAMUSCULAR | Status: AC | PRN
  Administered 2012-07-10: 60 mg via INTRAMUSCULAR

## 2012-07-10 MED ORDER — HYDROMORPHONE HCL PF 1 MG/ML IJ SOLN
0.2500 mg | INTRAMUSCULAR | Status: DC | PRN
  Administered 2012-07-10 (×2): 0.5 mg via INTRAVENOUS

## 2012-07-10 MED ORDER — PHENYLEPHRINE 40 MCG/ML (10ML) SYRINGE FOR IV PUSH (FOR BLOOD PRESSURE SUPPORT)
PREFILLED_SYRINGE | INTRAVENOUS | Status: AC
  Filled 2012-07-10: qty 10

## 2012-07-10 MED ORDER — KETOROLAC TROMETHAMINE 30 MG/ML IJ SOLN
30.0000 mg | Freq: Four times a day (QID) | INTRAMUSCULAR | Status: AC | PRN
  Administered 2012-07-11: 30 mg via INTRAVENOUS
  Filled 2012-07-10: qty 1

## 2012-07-10 MED ORDER — NALOXONE HCL 0.4 MG/ML IJ SOLN: 0.4000 mg | INTRAMUSCULAR | Status: DC | PRN

## 2012-07-10 MED ORDER — CEFAZOLIN SODIUM-DEXTROSE 2-3 GM-% IV SOLR
2.0000 g | Freq: Once | INTRAVENOUS | Status: AC
  Administered 2012-07-10: 2 g via INTRAVENOUS
  Filled 2012-07-10: qty 50

## 2012-07-10 MED ORDER — FENTANYL CITRATE 0.05 MG/ML IJ SOLN
INTRAMUSCULAR | Status: AC
  Filled 2012-07-10: qty 2

## 2012-07-10 MED ORDER — SODIUM CHLORIDE 0.9 % IJ SOLN: 3.0000 mL | INTRAMUSCULAR | Status: DC | PRN

## 2012-07-10 MED ORDER — BUTORPHANOL TARTRATE 1 MG/ML IJ SOLN
1.0000 mg | INTRAMUSCULAR | Status: DC | PRN
  Administered 2012-07-10 (×2): 1 mg via INTRAVENOUS
  Filled 2012-07-10 (×2): qty 1

## 2012-07-10 MED ORDER — FENTANYL 2.5 MCG/ML BUPIVACAINE 1/10 % EPIDURAL INFUSION (WH - ANES)
14.0000 mL/h | INTRAMUSCULAR | Status: DC
  Filled 2012-07-10: qty 125

## 2012-07-10 MED ORDER — MIDAZOLAM HCL 2 MG/2ML IJ SOLN
INTRAMUSCULAR | Status: AC
  Filled 2012-07-10: qty 2

## 2012-07-10 MED ORDER — LANOLIN HYDROUS EX OINT: 1.0000 "application " | TOPICAL_OINTMENT | CUTANEOUS | Status: DC | PRN

## 2012-07-10 MED ORDER — SIMETHICONE 80 MG PO CHEW
80.0000 mg | CHEWABLE_TABLET | Freq: Three times a day (TID) | ORAL | Status: DC
  Administered 2012-07-11 – 2012-07-13 (×9): 80 mg via ORAL

## 2012-07-10 MED ORDER — LACTATED RINGERS IV SOLN: INTRAVENOUS | Status: DC

## 2012-07-10 MED ORDER — SODIUM BICARBONATE 8.4 % IV SOLN
INTRAVENOUS | Status: DC | PRN
  Administered 2012-07-10: 6 mL via EPIDURAL

## 2012-07-10 MED ORDER — ONDANSETRON HCL 4 MG/2ML IJ SOLN
INTRAMUSCULAR | Status: DC | PRN
  Administered 2012-07-10: 4 mg via INTRAVENOUS

## 2012-07-10 MED ORDER — OXYTOCIN 40 UNITS IN LACTATED RINGERS INFUSION - SIMPLE MED
62.5000 mL/h | INTRAVENOUS | Status: DC
  Filled 2012-07-10: qty 1000

## 2012-07-10 MED ORDER — LACTATED RINGERS IV SOLN
INTRAVENOUS | Status: DC
Start: 2012-07-11 — End: 2012-07-13
  Administered 2012-07-11: via INTRAVENOUS

## 2012-07-10 MED ORDER — HYDROMORPHONE HCL PF 1 MG/ML IJ SOLN
INTRAMUSCULAR | Status: AC
  Filled 2012-07-10: qty 1

## 2012-07-10 MED ORDER — ONDANSETRON HCL 4 MG/2ML IJ SOLN: 4.0000 mg | Freq: Four times a day (QID) | INTRAMUSCULAR | Status: DC | PRN

## 2012-07-10 MED ORDER — ONDANSETRON HCL 4 MG PO TABS: 4.0000 mg | ORAL_TABLET | ORAL | Status: DC | PRN

## 2012-07-10 MED ORDER — LACTATED RINGERS IV BOLUS (SEPSIS)
1000.0000 mL | Freq: Once | INTRAVENOUS | Status: AC
  Administered 2012-07-10: 1000 mL via INTRAVENOUS

## 2012-07-10 MED ORDER — ZOLPIDEM TARTRATE 5 MG PO TABS: 5.0000 mg | ORAL_TABLET | Freq: Every evening | ORAL | Status: DC | PRN

## 2012-07-10 MED ORDER — SODIUM CHLORIDE 0.9 % IR SOLN
Status: DC | PRN
  Administered 2012-07-10: 1000 mL

## 2012-07-10 MED ORDER — PHENYLEPHRINE HCL 10 MG/ML IJ SOLN
INTRAMUSCULAR | Status: DC | PRN
  Administered 2012-07-10 (×3): 40 ug via INTRAVENOUS

## 2012-07-10 MED ORDER — CITRIC ACID-SODIUM CITRATE 334-500 MG/5ML PO SOLN
30.0000 mL | ORAL | Status: DC | PRN
  Administered 2012-07-10: 30 mL via ORAL
  Filled 2012-07-10: qty 15

## 2012-07-10 MED ORDER — DIPHENHYDRAMINE HCL 50 MG/ML IJ SOLN: 12.5000 mg | INTRAMUSCULAR | Status: DC | PRN

## 2012-07-10 MED ORDER — KETOROLAC TROMETHAMINE 60 MG/2ML IM SOLN
INTRAMUSCULAR | Status: AC
  Filled 2012-07-10: qty 2

## 2012-07-10 MED ORDER — IBUPROFEN 600 MG PO TABS
600.0000 mg | ORAL_TABLET | Freq: Four times a day (QID) | ORAL | Status: DC
  Administered 2012-07-11 – 2012-07-13 (×9): 600 mg via ORAL
  Filled 2012-07-10 (×9): qty 1

## 2012-07-10 MED ORDER — TETANUS-DIPHTH-ACELL PERTUSSIS 5-2.5-18.5 LF-MCG/0.5 IM SUSP: 0.5000 mL | Freq: Once | INTRAMUSCULAR | Status: DC

## 2012-07-10 MED ORDER — EPHEDRINE 5 MG/ML INJ
10.0000 mg | INTRAVENOUS | Status: DC | PRN
  Filled 2012-07-10: qty 2
  Filled 2012-07-10: qty 4

## 2012-07-10 MED ORDER — EPHEDRINE 5 MG/ML INJ: 10.0000 mg | INTRAVENOUS | Status: DC | PRN

## 2012-07-10 MED ORDER — SCOPOLAMINE 1 MG/3DAYS TD PT72
MEDICATED_PATCH | TRANSDERMAL | Status: AC
  Filled 2012-07-10: qty 1

## 2012-07-10 MED ORDER — SIMETHICONE 80 MG PO CHEW: 80.0000 mg | CHEWABLE_TABLET | ORAL | Status: DC | PRN

## 2012-07-10 MED ORDER — METOCLOPRAMIDE HCL 5 MG/ML IJ SOLN: 10.0000 mg | Freq: Three times a day (TID) | INTRAMUSCULAR | Status: DC | PRN

## 2012-07-10 MED ORDER — KETOROLAC TROMETHAMINE 30 MG/ML IJ SOLN: 30.0000 mg | Freq: Four times a day (QID) | INTRAMUSCULAR | Status: AC | PRN

## 2012-07-10 MED ORDER — LACTATED RINGERS IV SOLN
INTRAVENOUS | Status: DC | PRN
  Administered 2012-07-10 (×4): via INTRAVENOUS

## 2012-07-10 MED ORDER — DIPHENHYDRAMINE HCL 50 MG/ML IJ SOLN
12.5000 mg | INTRAMUSCULAR | Status: DC | PRN
Start: 2012-07-10 — End: 2012-07-10

## 2012-07-10 MED ORDER — SENNOSIDES-DOCUSATE SODIUM 8.6-50 MG PO TABS
2.0000 | ORAL_TABLET | Freq: Every day | ORAL | Status: DC
  Administered 2012-07-11 – 2012-07-12 (×2): 2 via ORAL

## 2012-07-10 MED ORDER — FENTANYL CITRATE 0.05 MG/ML IJ SOLN
INTRAMUSCULAR | Status: DC | PRN
Start: 2012-07-10 — End: 2012-07-10
  Administered 2012-07-10: 12.5 ug via INTRATHECAL
  Administered 2012-07-10: 50 ug via EPIDURAL

## 2012-07-10 MED ORDER — BUPIVACAINE IN DEXTROSE 0.75-8.25 % IT SOLN
INTRATHECAL | Status: DC | PRN
  Administered 2012-07-10: 1.2 mL via INTRATHECAL

## 2012-07-10 MED ORDER — NALOXONE HCL 0.4 MG/ML IJ SOLN
INTRAMUSCULAR | Status: AC
  Filled 2012-07-10: qty 1

## 2012-07-10 MED ORDER — ACETAMINOPHEN 325 MG PO TABS: 650.0000 mg | ORAL_TABLET | ORAL | Status: DC | PRN

## 2012-07-10 MED ORDER — LIDOCAINE-EPINEPHRINE (PF) 2 %-1:200000 IJ SOLN
INTRAMUSCULAR | Status: DC | PRN
  Administered 2012-07-10: 7 mL

## 2012-07-10 MED ORDER — ONDANSETRON HCL 4 MG/2ML IJ SOLN
INTRAMUSCULAR | Status: AC
  Filled 2012-07-10: qty 2

## 2012-07-10 MED ORDER — FENTANYL 2.5 MCG/ML BUPIVACAINE 1/10 % EPIDURAL INFUSION (WH - ANES)
INTRAMUSCULAR | Status: DC | PRN
  Administered 2012-07-10: 14 mL/h via EPIDURAL

## 2012-07-10 SURGICAL SUPPLY — 35 items
CLOTH BEACON ORANGE TIMEOUT ST (SAFETY) ×2 IMPLANT
CONTAINER PREFILL 10% NBF 15ML (MISCELLANEOUS) IMPLANT
DRAPE SURG 17X23 STRL (DRAPES) ×2 IMPLANT
DRSG COVADERM 4X10 (GAUZE/BANDAGES/DRESSINGS) ×2 IMPLANT
DURAPREP 26ML APPLICATOR (WOUND CARE) ×2 IMPLANT
ELECT REM PT RETURN 9FT ADLT (ELECTROSURGICAL) ×2
ELECTRODE REM PT RTRN 9FT ADLT (ELECTROSURGICAL) ×1 IMPLANT
EXTRACTOR VACUUM M CUP 4 TUBE (SUCTIONS) IMPLANT
GLOVE ECLIPSE 6.0 STRL STRAW (GLOVE) ×2 IMPLANT
GLOVE ECLIPSE 6.5 STRL STRAW (GLOVE) ×2 IMPLANT
GOWN PREVENTION PLUS LG XLONG (DISPOSABLE) ×6 IMPLANT
KIT ABG SYR 3ML LUER SLIP (SYRINGE) IMPLANT
NEEDLE HYPO 25X5/8 SAFETYGLIDE (NEEDLE) IMPLANT
NS IRRIG 1000ML POUR BTL (IV SOLUTION) ×2 IMPLANT
PACK C SECTION WH (CUSTOM PROCEDURE TRAY) ×2 IMPLANT
PAD ABD 7.5X8 STRL (GAUZE/BANDAGES/DRESSINGS) ×2 IMPLANT
PAD OB MATERNITY 4.3X12.25 (PERSONAL CARE ITEMS) ×2 IMPLANT
RTRCTR C-SECT PINK 25CM LRG (MISCELLANEOUS) ×2 IMPLANT
SLEEVE SCD COMPRESS KNEE MED (MISCELLANEOUS) ×2 IMPLANT
STAPLER VISISTAT 35W (STAPLE) IMPLANT
SUT PLAIN 0 NONE (SUTURE) IMPLANT
SUT VIC AB 0 CT1 27 (SUTURE) ×3
SUT VIC AB 0 CT1 27XBRD ANBCTR (SUTURE) ×3 IMPLANT
SUT VIC AB 1 CTX 36 (SUTURE) ×2
SUT VIC AB 1 CTX36XBRD ANBCTRL (SUTURE) ×2 IMPLANT
SUT VIC AB 3-0 CT1 27 (SUTURE) ×1
SUT VIC AB 3-0 CT1 TAPERPNT 27 (SUTURE) ×1 IMPLANT
SUT VIC AB 3-0 PS2 18 (SUTURE)
SUT VIC AB 3-0 PS2 18XBRD (SUTURE) IMPLANT
SUT VIC AB 3-0 SH 27 (SUTURE) ×1
SUT VIC AB 3-0 SH 27X BRD (SUTURE) ×1 IMPLANT
TAPE CLOTH SURG 4X10 WHT LF (GAUZE/BANDAGES/DRESSINGS) ×2 IMPLANT
TOWEL OR 17X24 6PK STRL BLUE (TOWEL DISPOSABLE) ×4 IMPLANT
TRAY FOLEY CATH 14FR (SET/KITS/TRAYS/PACK) IMPLANT
WATER STERILE IRR 1000ML POUR (IV SOLUTION) ×2 IMPLANT

## 2012-07-10 NOTE — Progress Notes (Signed)
crna's at bedside

## 2012-07-10 NOTE — Anesthesia Postprocedure Evaluation (Signed)
Anesthesia Post Note  Patient: Tonya Arnold  Procedure(s) Performed: Procedure(s) (LRB): CESAREAN SECTION (N/A)  Anesthesia type: Spinal  Patient location: PACU  Post pain: Pain level controlled  Post assessment: Post-op Vital signs reviewed  Last Vitals:  Filed Vitals:   07/10/12 1800  BP: 116/69  Pulse: 97  Temp:   Resp: 36    Post vital signs: Reviewed  Level of consciousness: awake  Complications: No apparent anesthesia complications

## 2012-07-10 NOTE — H&P (Signed)
26 y.o. G2P1001  Estimated Date of Delivery: 07/13/12 admitted at 39/[redacted] weeks gestation in labor.  Prenatal Transfer Tool  Maternal Diabetes: No Genetic Screening: Declined Maternal Ultrasounds/Referrals: Normal Fetal Ultrasounds or other Referrals:  None Maternal Substance Abuse:  No Significant Maternal Medications:  None Significant Maternal Lab Results: Lab values include: Group B Strep positive Other Significant Pregnancy Complications:  Previous Cesarean delivery  Afebrile, VSS Heart and Lungs: No active disease Abdomen: soft, gravid, EFW AGA. Cervical exam:  4/90, Vtx -1 (MAU nurse exam)  Impression: Labor, pt. Desires VBAC  Plan:  TOLAC, GBS prophylaxis

## 2012-07-10 NOTE — MAU Note (Signed)
Patient states she is having contractions every 5 minutes with mucus discharge. Denies any bleeding or leaking and reports good fetal movement. Patient is for a TOLAC.

## 2012-07-10 NOTE — OR Nursing (Addendum)
Uterus massaged by S. Ysabela Keisler Charity fundraiser.  Two tubes of cord blood sent to lab. Foley catheter in place upon arrival to OR. Urine color-concentrated.  25cc of blood evacuated from uterus during uterine massage.

## 2012-07-10 NOTE — Consult Note (Signed)
Neonatology Note:   Attendance at C-section:    I was asked to attend this repeat C/S at term. The mother is a G2P1 O pos, GBS pos with brow presentation and previous C/S. ROM 2 hours prior to delivery, fluid clear. She received Ancef about 6 hours prior to delivery.  The mother had received 2 doses of Stadol and Versed shortly before delivery. Infant vigorous with good spontaneous cry and tone. Needed only minimal bulb suctioning. Ap 8/9. Lungs clear to ausc in DR. No irregularity of breathing seen during the first 5 minutes, so allowed to stay for skin to skin time under supervision of L and D nurse. To CN to care of Pediatrician.   Deatra James, MD

## 2012-07-10 NOTE — Transfer of Care (Signed)
Immediate Anesthesia Transfer of Care Note  Patient: Tonya Arnold  Procedure(s) Performed: Procedure(s) (LRB) with comments: CESAREAN SECTION (N/A) - Repeat cesarean section with delivery of baby girl at 1700.  Apgars 8/9.  Patient Location: PACU  Anesthesia Type:Spinal  Level of Consciousness: awake, alert  and oriented  Airway & Oxygen Therapy: Patient Spontanous Breathing  Post-op Assessment: Report given to PACU RN and Post -op Vital signs reviewed and stable  Post vital signs: Reviewed and stable  Complications: No apparent anesthesia complications

## 2012-07-10 NOTE — Anesthesia Preprocedure Evaluation (Signed)

## 2012-07-10 NOTE — Progress Notes (Signed)
Called Dr. Arlyce Dice to come and evaluate pt status per primary RN and CRNA request. Pt very uncomfortable after epidural and redoses. FHR reassuring intermittently. Dr. Arlyce Dice states he is on his way.

## 2012-07-10 NOTE — Op Note (Addendum)
Patient Name: Tonya Arnold MRN: [id number]  Date of Surgery: 07/10/2012    PREOPERATIVE DIAGNOSIS: Brow presentation  POSTOPERATIVE DIAGNOSIS: Brow presentation   PROCEDURE: Low Vertical Cesarean Section  SURGEON: Caralyn Guile. Arlyce Dice M.D.  ASSISTANT: Antionette Char  ANESTHESIA: Spinal  ESTIMATED BLOOD LOSS: 600 ml  FINDINGS: Female, Apgar 9,9; Weight 7 lbs 2 oz;clear fluid, normal uterus and adnexa, baby in brow presentation.   INDICATIONS: Attempted VBAC.  Noted to be in a Brow presentation in 2nd stage.  I recommended cesarean delivery and the parents agreed.  PROCEDURE IN DETAIL: The patient was taken to the operating room and the epidural that had been placed in labor was inadequate for surgical anesthesia.  A successful spinal anesthesia was administered and she was then placed in the supine position with left lateral displacement of the uterus. The abdomen was prepped and draped in a sterile fashion and the bladder was catheterized.  A low transverse abdominal incision was made and carried down to the fascia. The fascia was opened transversely and the rectus sheath was dissected from the underlying rectus muscle. The rectus midline was identified and opened by sharp and blunt dissection. The peritoneum was opened. An Alexis retractor was placed and the lower uterine segment was identified.  The bladder flap was created and the bladder was  dissected free from the lower uterine segment.  Because of the hyperextended vertex which was at +2 station, a low vertical incision was made so that it could be extended if necessary.  The infant was delivered without difficulty. The placenta was delivered and the uterus was bluntly curettage. The lower segment was closed with running interlocking Vicryl 1 suture.  A second imbricating Vicryl 1 suture line was placed.  The bladder flap was closed with 3-0 Vicryl and the peritoneum and rectus muscle were closed in the midline with  running 3-0 Vicryl suture. The fascia was closed with running 0 Vicryl suture and the skin was closed with staples. All sponge and instrument counts were correct.  The patient tolerated the procedure well and left the operating room in good condition.

## 2012-07-10 NOTE — MAU Note (Signed)
"  contractions about every 3 minutes.  No VB.  Possibly leaking of fluid. (+) FM."

## 2012-07-10 NOTE — Anesthesia Procedure Notes (Addendum)
Epidural Patient location during procedure: OB Start time: 07/10/2012 11:42 AM End time: 07/10/2012 11:46 AM  Staffing Anesthesiologist: Sandrea Hughs Performed by: anesthesiologist   Preanesthetic Checklist Completed: patient identified, site marked, surgical consent, pre-op evaluation, timeout performed, IV checked, risks and benefits discussed and monitors and equipment checked  Epidural Patient position: sitting Prep: site prepped and draped and DuraPrep Patient monitoring: continuous pulse ox and blood pressure Approach: midline Injection technique: LOR air  Needle:  Needle type: Tuohy  Needle gauge: 17 G Needle length: 9 cm and 9 Needle insertion depth: 6 cm Catheter type: closed end flexible Catheter size: 19 Gauge Catheter at skin depth: 11 cm Test dose: negative and Other  Assessment Sensory level: T8 Events: blood not aspirated, injection not painful, no injection resistance, negative IV test and no paresthesia  Additional Notes Reason for block:procedure for pain  Epidural Patient location during procedure: OB Start time: 07/10/2012 2:35 PM End time: 07/10/2012 2:45 PM  Staffing Anesthesiologist: Sandrea Hughs Performed by: anesthesiologist   Preanesthetic Checklist Completed: patient identified, site marked, surgical consent, pre-op evaluation, timeout performed, IV checked, risks and benefits discussed and monitors and equipment checked  Epidural Patient position: sitting Prep: site prepped and draped and DuraPrep Patient monitoring: continuous pulse ox and blood pressure Approach: midline Injection technique: LOR air  Needle:  Needle type: Tuohy  Needle gauge: 17 G Needle length: 9 cm and 9 Needle insertion depth: 6 cm Catheter type: closed end flexible Catheter size: 19 Gauge Catheter at skin depth: 11 cm Test dose: negative and 2% lidocaine with Epi 1:200 K  Assessment Events: blood not aspirated, injection  not painful, no injection resistance, negative IV test and no paresthesia  Additional Notes Reason for block:procedure for pain  Spinal  Patient location during procedure: OR Start time: 07/10/2012 4:37 PM End time: 07/10/2012 4:40 PM Staffing Anesthesiologist: Sandrea Hughs Performed by: anesthesiologist  Preanesthetic Checklist Completed: patient identified, site marked, surgical consent, pre-op evaluation, timeout performed, IV checked, risks and benefits discussed and monitors and equipment checked Spinal Block Patient position: sitting Prep: DuraPrep Patient monitoring: heart rate, cardiac monitor, continuous pulse ox and blood pressure Approach: midline Location: L3-4 Injection technique: single-shot Needle Needle type: Sprotte  Needle gauge: 24 G Needle length: 9 cm Needle insertion depth: 7 cm Assessment Sensory level: T4

## 2012-07-10 NOTE — Anesthesia Postprocedure Evaluation (Signed)
Anesthesia Post Note  Patient: Tonya Arnold  Procedure(s) Performed: Procedure(s) (LRB): CESAREAN SECTION (N/A)  Anesthesia type: Spinal  Patient location: PACU  Post pain: Pain level controlled  Post assessment: Post-op Vital signs reviewed  Last Vitals:  Filed Vitals:   07/10/12 1800  BP: 116/69  Pulse: 97  Temp:   Resp: 36    Post vital signs: Reviewed  Level of consciousness: awake  Complications: No apparent anesthesia complications 

## 2012-07-10 NOTE — MAU Provider Note (Signed)
  History     CSN: [id number]  Arrival date and time: 07/10/12 0906   None     Chief Complaint  Patient presents with  . Labor Eval   HPI  Asked by RN to verify ROM. Fern negative.  Past Medical History  Diagnosis Date  . No pertinent past medical history     Past Surgical History  Procedure Date  . Cesarean section     No family history on file.  History  Substance Use Topics  . Smoking status: Never Smoker   . Smokeless tobacco: Not on file  . Alcohol Use: No    Allergies:  Allergies  Allergen Reactions  . Amoxicillin Rash    Pt states she is not allergic to penicillin or any other antibiotic    Prescriptions prior to admission  Medication Sig Dispense Refill  . dextromethorphan (DELSYM) 30 MG/5ML liquid Take 60 mg by mouth 2 (two) times daily.      Marland Kitchen FOLIC ACID PO Take 1 tablet by mouth every morning.      . menthol-cetylpyridinium (CEPACOL) 3 MG lozenge Take 1 lozenge by mouth as needed. For sore throat      . ondansetron (ZOFRAN-ODT) 4 MG disintegrating tablet Take 4 mg by mouth every 8 (eight) hours as needed. For nausea      . promethazine (PHENERGAN) 25 MG tablet Take 1 tablet (25 mg total) by mouth every 6 (six) hours as needed for nausea.  30 tablet  0    ROS Physical Exam   Blood pressure 122/76, pulse 103, temperature 98 F (36.7 C), temperature source Oral, resp. rate 20, height 5\' 7"  (1.702 m), weight 78.019 kg (172 lb).  Physical Exam  External genitalia normal Vagina: normal No pooling in vault No fluid seen.   FHT category 1 Contractions q3-4 min MAU Course  Procedures    Assessment and Plan  Reassuring M-F Status Membranes Intact.  Tawnya Crook 07/10/2012, 9:49 AM

## 2012-07-10 NOTE — Progress Notes (Signed)
Dr Arlyce Dice - crna and 2 additional rn's at bedside

## 2012-07-11 ENCOUNTER — Encounter (HOSPITAL_COMMUNITY): Payer: Self-pay | Admitting: Obstetrics & Gynecology

## 2012-07-11 LAB — CBC
HCT: 25.8 % — ABNORMAL LOW (ref 36.0–46.0)
Hemoglobin: 8.3 g/dL — ABNORMAL LOW (ref 12.0–15.0)
MCH: 25.8 pg — ABNORMAL LOW (ref 26.0–34.0)
MCHC: 32.2 g/dL (ref 30.0–36.0)
MCV: 80.1 fL (ref 78.0–100.0)
Platelets: 155 10*3/uL (ref 150–400)
RBC: 3.22 MIL/uL — ABNORMAL LOW (ref 3.87–5.11)
RDW: 14.6 % (ref 11.5–15.5)
WBC: 12.2 10*3/uL — ABNORMAL HIGH (ref 4.0–10.5)

## 2012-07-11 MED ORDER — FERROUS SULFATE 325 (65 FE) MG PO TABS
325.0000 mg | ORAL_TABLET | Freq: Two times a day (BID) | ORAL | Status: DC
  Administered 2012-07-11 – 2012-07-13 (×4): 325 mg via ORAL
  Filled 2012-07-11 (×4): qty 1

## 2012-07-11 NOTE — Addendum Note (Signed)
Addendum  created 07/11/12 0753 by Suella Grove, CRNA   Modules edited:Notes Section

## 2012-07-11 NOTE — Anesthesia Postprocedure Evaluation (Signed)
  Anesthesia Post-op Note  Patient: Tonya Arnold  Procedure(s) Performed: Procedure(s) (LRB) with comments: CESAREAN SECTION (N/A) - Repeat cesarean section with delivery of baby girl at 1700.  Apgars 8/9.  Patient Location: Mother/Baby  Anesthesia Type:Epidural  Level of Consciousness: awake  Airway and Oxygen Therapy: Patient Spontanous Breathing  Post-op Pain: none  Post-op Assessment: Patient's Cardiovascular Status Stable, Respiratory Function Stable, Patent Airway, No signs of Nausea or vomiting, Adequate PO intake, Pain level controlled, No headache, No backache, No residual numbness and No residual motor weakness  Post-op Vital Signs: Reviewed and stable  Complications: No apparent anesthesia complications

## 2012-07-11 NOTE — Progress Notes (Signed)
POD#1 Pt is tired. Lochia-mild HGB-8.3 VSSAF Abd- soft, non tender. IMP/ Anemic Plan/ routine care.          FeSO4

## 2012-07-12 LAB — BIRTH TISSUE RECOVERY COLLECTION (PLACENTA DONATION)

## 2012-07-12 NOTE — Progress Notes (Signed)
UR chart review completed.  

## 2012-07-12 NOTE — Progress Notes (Signed)
Subjective: Postpartum Day 2: Cesarean Delivery Patient reports incisional pain, + flatus and no problems voiding.  Problems with nipple pain with breast feeding  Objective: Vital signs in last 24 hours: Temp:  [98.2 F (36.8 C)-98.7 F (37.1 C)] 98.2 F (36.8 C) (11/12 0520) Pulse Rate:  [79-89] 82  (11/12 0520) Resp:  [18] 18  (11/12 0520) BP: (99-110)/(63-70) 106/70 mmHg (11/12 0520) SpO2:  [97 %-98 %] 97 % (11/11 1731)  Physical Exam:  General: alert, cooperative and appears stated age Lochia: appropriate Uterine Fundus: firm Incision: healing well DVT Evaluation: No evidence of DVT seen on physical exam.   Basename 07/11/12 0515 07/10/12 1010  HGB 8.3* 11.0*  HCT 25.8* 33.5*    Assessment/Plan: Status post Cesarean section. Doing well postoperatively.  Continue current care. Work with lactation  D/C tomorrow  Sunnie Odden H. 07/12/2012, 9:45 AM

## 2012-07-13 MED ORDER — IBUPROFEN 600 MG PO TABS: 600.0000 mg | ORAL_TABLET | Freq: Four times a day (QID) | ORAL | Status: DC

## 2012-07-13 MED ORDER — OXYCODONE-ACETAMINOPHEN 5-325 MG PO TABS: 1.0000 | ORAL_TABLET | ORAL | Status: DC | PRN

## 2012-07-13 NOTE — Discharge Summary (Signed)
Obstetric Discharge Summary Reason for Admission: onset of labor Prenatal Procedures: none Intrapartum Procedures: cesarean: low cervical, vertical Postpartum Procedures: none Complications-Operative and Postpartum: none Hemoglobin  Date Value Range Status  07/11/2012 8.3* 12.0 - 15.0 g/dL Final     DELTA CHECK NOTED     REPEATED TO VERIFY     HCT  Date Value Range Status  07/11/2012 25.8* 36.0 - 46.0 % Final    Physical Exam:  General: alert Lochia: appropriate Uterine Fundus: firm Incision: healing well  Discharge Diagnoses: Term Pregnancy-delivered, Brow presentation, Previous cesarean delivery  Discharge Information: Date: 07/13/2012 Activity: Limited Diet: routine Medications: PNV, Ibuprofen and Percocet Condition: improved Instructions: refer to practice specific booklet Discharge to: home Follow-up Information    Follow up with Mickel Baas, MD. Schedule an appointment as soon as possible for a visit in 4 weeks.   Contact information:   [address] [place] [address] [phone]          Newborn Data: Live born female  Birth Weight: 7 lb 1.9 oz (3229 g) APGAR: 8, 9  Home with mother.  Tonya Arnold D 07/13/2012, 8:47 AM

## 2012-07-19 ENCOUNTER — Telehealth (HOSPITAL_COMMUNITY): Payer: Self-pay | Admitting: *Deleted

## 2012-07-19 NOTE — Telephone Encounter (Signed)
Resolve episode 

## 2013-08-15 ENCOUNTER — Ambulatory Visit: Payer: 59 | Admitting: Family Medicine

## 2013-08-15 VITALS — BP 105/70 | HR 90 | Temp 98.5°F | Resp 16 | Ht 65.0 in | Wt 158.4 lb

## 2013-08-15 DIAGNOSIS — R5383 Other fatigue: Secondary | ICD-10-CM

## 2013-08-15 DIAGNOSIS — J3489 Other specified disorders of nose and nasal sinuses: Secondary | ICD-10-CM

## 2013-08-15 DIAGNOSIS — R5381 Other malaise: Secondary | ICD-10-CM

## 2013-08-15 LAB — POCT INFLUENZA A/B
Influenza A, POC: NEGATIVE
Influenza B, POC: NEGATIVE

## 2013-08-15 MED ORDER — AZITHROMYCIN 250 MG PO TABS: ORAL_TABLET | ORAL | Status: DC

## 2013-08-15 NOTE — Progress Notes (Signed)
[facility] [address], [place] [address] [phone]  Date:  08/15/2013   Name:  Tonya Arnold   DOB:  [birth year]   MRN:  [id number]  PCP:  No primary provider on file.    Chief Complaint: Headache, Sinusitis and Generalized Body Aches   History of Present Illness:  Tonya Arnold is a 27 y.o. very pleasant female patient who presents with the following:  She noted onset of sinus pain, congestion, ears ache yesteray.  No cough, no fever.  Her body does ache.  "my sinuses feel terrible." Yesterday she did vomit once- none today. No diarrhea.   No abdominal pain.  No sick contacts at home She has not taken any fever reducers today LMP was early December. She has 2 children- one is just over a year old.  She is NOT lactating now She is generally healthy.   She tried sudafed yesterday.  No other meds  There are no active problems to display for this patient.   Past Medical History  Diagnosis Date  . No pertinent past medical history     Past Surgical History  Procedure Laterality Date  . Cesarean section    . Cesarean section  07/10/2012    Procedure: CESAREAN SECTION;  Surgeon: Mickel Baas, MD;  Location: [facility];  Service: Obstetrics;  Laterality: N/A;  Repeat cesarean section with delivery of baby girl at 1700.  Apgars 8/9.    History  Substance Use Topics  . Smoking status: Never Smoker   . Smokeless tobacco: Never Used  . Alcohol Use: No    Family History  Problem Relation Age of Onset  . Asthma Mother   . Diabetes Father   . Hypertension Father   . Kidney disease Father     Allergies  Allergen Reactions  . Amoxicillin Rash    Pt states she is not allergic to penicillin or any other antibiotic    Medication list has been reviewed and updated.  Current Outpatient Prescriptions on File Prior to Visit  Medication Sig Dispense Refill  . doxylamine, Sleep, (UNISOM) 25 MG tablet Take 25 mg by mouth at  bedtime as needed. sleep      . ibuprofen (ADVIL,MOTRIN) 600 MG tablet Take 1 tablet (600 mg total) by mouth every 6 (six) hours.  30 tablet  1  . oxyCODONE-acetaminophen (PERCOCET/ROXICET) 5-325 MG per tablet Take 1-2 tablets by mouth every 4 (four) hours as needed (moderate - severe pain).  30 tablet  0  . pseudoephedrine (SUDAFED) 120 MG 12 hr tablet Take 120 mg by mouth every 12 (twelve) hours as needed. congestion       No current facility-administered medications on file prior to visit.    Review of Systems:  As per HPI- otherwise negative.   Physical Examination: Filed Vitals:   08/15/13 1530  BP: 104/66  Pulse: 90  Temp: 98.5 F (36.9 C)  Resp: 16   Filed Vitals:   08/15/13 1530  Height: 5\' 5"  (1.651 m)  Weight: 158 lb 6.4 oz (71.85 kg)   Body mass index is 26.36 kg/(m^2). Ideal Body Weight: Weight in (lb) to have BMI = 25: 149.9  GEN: WDWN, NAD, Non-toxic, A & O x 3, looks well HEENT: Atraumatic, Normocephalic. Neck supple. No masses, No LAD. Bilateral TM wnl, oropharynx normal.  PEERL,EOMI.   Ears and Nose: No external deformity. CV: RRR, No M/G/R. No JVD. No thrill. No extra heart sounds. PULM: CTA B,  no wheezes, crackles, rhonchi. No retractions. No resp. distress. No accessory muscle use. ABD: S, NT, ND, +BS. No rebound. No HSM. EXTR: No c/c/e NEURO Normal gait.  PSYCH: Normally interactive. Conversant. Not depressed or anxious appearing.  Calm demeanor.   Results for orders placed in visit on 08/15/13  POCT INFLUENZA A/B      Result Value Range   Influenza A, POC Negative     Influenza B, POC Negative       Assessment and Plan: Other malaise and fatigue - Plan: POCT Influenza A/B  Sinus pain - Plan: POCT Influenza A/B, azithromycin (ZITHROMAX) 250 MG tablet  Treat for sinusitis with azithromycin.   She reports that she had always tolerated amox well until one time when she was a teen and she was given amox for some illness.  A couple of days later  she developed a bad rash.  Explained that she likely had the rash due to mono at that time, but we should continue to avoid amox anyway  Signed Abbe Amsterdam, MD

## 2013-08-15 NOTE — Patient Instructions (Signed)
Use the azithromycin as directed. Try mucinex as needed for sinus congestion.  You can also try ibuprofen or tylenol as needed for fever and pain.  Let me know if you are not better in the next few days- Sooner if worse.

## 2013-09-30 ENCOUNTER — Ambulatory Visit (INDEPENDENT_AMBULATORY_CARE_PROVIDER_SITE_OTHER): Payer: 59 | Admitting: Family Medicine

## 2013-09-30 VITALS — BP 122/80 | HR 114 | Temp 101.9°F | Resp 16 | Ht 65.0 in | Wt 157.0 lb

## 2013-09-30 DIAGNOSIS — J3489 Other specified disorders of nose and nasal sinuses: Secondary | ICD-10-CM

## 2013-09-30 DIAGNOSIS — R05 Cough: Secondary | ICD-10-CM

## 2013-09-30 DIAGNOSIS — R059 Cough, unspecified: Secondary | ICD-10-CM

## 2013-09-30 DIAGNOSIS — R0981 Nasal congestion: Secondary | ICD-10-CM

## 2013-09-30 DIAGNOSIS — R509 Fever, unspecified: Secondary | ICD-10-CM

## 2013-09-30 LAB — POCT INFLUENZA A/B
Influenza A, POC: NEGATIVE
Influenza B, POC: NEGATIVE

## 2013-09-30 MED ORDER — AZITHROMYCIN 250 MG PO TABS: ORAL_TABLET | ORAL | Status: DC

## 2013-09-30 NOTE — Patient Instructions (Signed)

## 2013-09-30 NOTE — Progress Notes (Signed)
° °  Subjective:    Patient ID: Tonya Arnold, female    DOB: 03/08/1986, 28 y.o.   MRN: [id number]  HPI Chief Complaint  Patient presents with   Fever   Cough   This chart was scribed for Elvina SidleKurt Lauenstein, MD by Andrew Auaven Small, ED Scribe. This patient was seen in room 2 and the patient's care was started at 3:45 PM.  HPI Comments: Tonya MyrtleLemarys Naomi Lamos is a 28 y.o. female who presents to the [facility] complaining of a worsening cough and fever onset 5 days ago. She reports that everyone in her household has been sick and that while everyone is getting better her and her 114 m.o. daughter have been getting worse. She reports that she got sick 2 days after her daughter who is being seen with her. She also has associated congestion, cp, cough, sweats and fever. She reports that she has been unable to sleep due to her 5214 m.o. daughter being sick.   Past Medical History  Diagnosis Date   No pertinent past medical history    Allergies  Allergen Reactions   Amoxicillin Rash    Pt states she is not allergic to penicillin or any other antibiotic   Prior to Admission medications   Medication Sig Start Date End Date Taking? Authorizing Provider  azithromycin (ZITHROMAX) 250 MG tablet Use as a zpack 08/15/13   Gwenlyn FoundJessica C Copland, MD  doxylamine, Sleep, (UNISOM) 25 MG tablet Take 25 mg by mouth at bedtime as needed. sleep    Historical Provider, MD  ibuprofen (ADVIL,MOTRIN) 600 MG tablet Take 1 tablet (600 mg total) by mouth every 6 (six) hours. 07/13/12   Mickel Baasichard D Kaplan, MD  pseudoephedrine (SUDAFED) 120 MG 12 hr tablet Take 120 mg by mouth every 12 (twelve) hours as needed. congestion    Historical Provider, MD    Review of Systems  Constitutional: Positive for fever and chills.  HENT: Positive for congestion.   Respiratory: Positive for cough.   Cardiovascular: Positive for chest pain.       Objective:   Physical Exam Filed Vitals:   09/30/13 1539    BP: 122/80  Pulse: 114  Temp: 101.9 F (38.8 C)  Resp: 16   Results for orders placed in visit on 09/30/13  POCT INFLUENZA A/B      Result Value Range   Influenza A, POC Negative     Influenza B, POC Negative     HEENT: Unremarkable Neck: Supple no adenopathy Chest: Few expiratory wheezes Heart: Regular with a rate of about 100 Skin: Unremarkable    Assessment & Plan:   Cough - Plan: POCT Influenza A/B, azithromycin (ZITHROMAX Z-PAK) 250 MG tablet  Fever, unspecified - Plan: POCT Influenza A/B, azithromycin (ZITHROMAX Z-PAK) 250 MG tablet  Sinus congestion - Plan: azithromycin (ZITHROMAX Z-PAK) 250 MG tablet  Signed, Elvina SidleKurt Lauenstein, MD

## 2014-01-25 ENCOUNTER — Ambulatory Visit (INDEPENDENT_AMBULATORY_CARE_PROVIDER_SITE_OTHER): Payer: 59 | Admitting: Family Medicine

## 2014-01-25 VITALS — BP 110/62 | HR 104 | Temp 98.8°F | Resp 18 | Ht 65.0 in | Wt 156.0 lb

## 2014-01-25 DIAGNOSIS — R509 Fever, unspecified: Secondary | ICD-10-CM

## 2014-01-25 DIAGNOSIS — B349 Viral infection, unspecified: Secondary | ICD-10-CM

## 2014-01-25 DIAGNOSIS — R6889 Other general symptoms and signs: Secondary | ICD-10-CM

## 2014-01-25 DIAGNOSIS — B9789 Other viral agents as the cause of diseases classified elsewhere: Secondary | ICD-10-CM

## 2014-01-25 LAB — POCT CBC
Granulocyte percent: 76.4 %G (ref 37–80)
HCT, POC: 43.7 % (ref 37.7–47.9)
Hemoglobin: 14.1 g/dL (ref 12.2–16.2)
Lymph, poc: 0.9 (ref 0.6–3.4)
MCH, POC: 28.4 pg (ref 27–31.2)
MCHC: 32.3 g/dL (ref 31.8–35.4)
MCV: 88 fL (ref 80–97)
MID (cbc): 0.2 (ref 0–0.9)
MPV: 12.7 fL (ref 0–99.8)
POC Granulocyte: 3.7 (ref 2–6.9)
POC LYMPH PERCENT: 18.6 %L (ref 10–50)
POC MID %: 5 %M (ref 0–12)
Platelet Count, POC: 204 10*3/uL (ref 142–424)
RBC: 4.97 M/uL (ref 4.04–5.48)
RDW, POC: 13.9 %
WBC: 4.9 10*3/uL (ref 4.6–10.2)

## 2014-01-25 LAB — POCT URINALYSIS DIPSTICK
Bilirubin, UA: NEGATIVE
Glucose, UA: NEGATIVE
Ketones, UA: 40
Leukocytes, UA: NEGATIVE
Nitrite, UA: NEGATIVE
Protein, UA: 30
Spec Grav, UA: >=1.03
Urobilinogen, UA: 0.2
pH, UA: 5.5

## 2014-01-25 LAB — POCT UA - MICROSCOPIC ONLY
Casts, Ur, LPF, POC: NEGATIVE
Crystals, Ur, HPF, POC: NEGATIVE
Mucus, UA: NEGATIVE
Yeast, UA: NEGATIVE

## 2014-01-25 MED ORDER — ONDANSETRON 8 MG PO TBDP: 8.0000 mg | ORAL_TABLET | Freq: Three times a day (TID) | ORAL | Status: DC | PRN

## 2014-01-25 MED ORDER — HYDROCOD POLST-CHLORPHEN POLST 10-8 MG/5ML PO LQCR: 5.0000 mL | Freq: Two times a day (BID) | ORAL | Status: DC | PRN

## 2014-01-25 NOTE — Progress Notes (Addendum)
Subjective:  This chart was scribed for Norberto Sorenson, MD by Bronson Curb, ED Scribe. This patient was seen in room Room/bed 11 and the patient's care was started at 1:49 PM.   Patient ID: Tonya Arnold, female    DOB: [birth year], 28 y.o.   MRN: [id number]  Chief Complaint  Patient presents with  . Sore Throat    since friday   . Chills  . Fever  . Emesis  . Cough    at night unable to sleep  . Generalized Body Aches    HPI  HPI Comments: Tonya Arnold is a 28 y.o. female who presents to the Emergency Department complaining of sore throat onset 6 days ago. 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 She reports associated fever (T max 102 F 4 days ago), chills, cough, emesis (last episode yesterday), myalgias that have continued all week and are not improving. Patient states the cough is worse at night. Patient states she rested yesterday with no relief. Patient states she was able to eat lunch earliear today without vomiting but couldn't keep anything down yesterdau. Patient states she has tried every possible type of otc cough and cold medications with no relief. She states she took some expired prescribed cough medication yest which fortunately did help her rest.  She denies diarrhea. Patient denies any recent travels. LNMP 01/24/13. No one else at home sick  Past Medical History  Diagnosis Date  . No pertinent past medical history    Current Outpatient Prescriptions on File Prior to Visit  Medication Sig Dispense Refill  . azithromycin (ZITHROMAX Z-PAK) 250 MG tablet Take as directed on pack  6 tablet  0  . doxylamine, Sleep, (UNISOM) 25 MG tablet Take 25 mg by mouth at bedtime as needed. sleep      . ibuprofen (ADVIL,MOTRIN) 600 MG tablet Take 1 tablet (600 mg total) by mouth  every 6 (six) hours.  30 tablet  1  . pseudoephedrine (SUDAFED) 120 MG 12 hr tablet Take 120 mg by mouth every 12 (twelve) hours as needed. congestion       No current facility-administered medications on file prior to visit.   Allergies  Allergen Reactions  . Amoxicillin Rash    Pt states she is not allergic to penicillin or any other antibiotic    Review of Systems  Constitutional: Positive for fever, chills, diaphoresis, activity change, appetite change and fatigue. Negative for unexpected weight change.  HENT: Positive for ear pain and sore throat. Negative for congestion, ear discharge, mouth sores, nosebleeds, postnasal drip, rhinorrhea, sinus pressure, sneezing, trouble swallowing and voice change.   Respiratory: Positive for cough. Negative for shortness of breath.   Cardiovascular: Negative for chest pain.  Gastrointestinal: Positive for nausea and vomiting. Negative for abdominal pain and diarrhea.  Genitourinary: Positive for decreased urine volume. Negative for dysuria and frequency.  Musculoskeletal: Positive for arthralgias, myalgias and neck stiffness. Negative for gait problem, joint swelling and neck pain.  Skin: Negative for rash.  Neurological: Positive for weakness and headaches. Negative for dizziness and syncope.  Hematological: Negative for adenopathy.  Psychiatric/Behavioral: Positive for sleep disturbance.      BP 110/62  Pulse 104  Temp(Src) 98.8 F (37.1 C) (Oral)  Resp 18  Ht 5\' 5"  (1.651 m)  Wt 156 lb (70.761 kg)  BMI 25.96 kg/m2  SpO2 100%  LMP 01/24/2014  Objective:   Physical Exam  Nursing note and vitals reviewed. Constitutional: She is oriented to person, place, and time. She appears well-developed and well-nourished. No distress.  HENT:  Head: Normocephalic and atraumatic.  Right Ear: Tympanic membrane normal.  Left Ear: Tympanic membrane normal.  Nose: Nose normal.  Mouth/Throat: Posterior oropharyngeal erythema (mild) present.  Eyes:  EOM are normal.  Neck: Neck supple. No tracheal deviation present. No mass and no thyromegaly present.  Cardiovascular: Regular rhythm, S1 normal and S2 normal.  Tachycardia present.   No murmur heard. Pulmonary/Chest: Effort normal. No respiratory distress.  Abdominal: Soft. Bowel sounds are increased. There is no hepatosplenomegaly. There is tenderness in the suprapubic area. There is no rigidity, no rebound, no guarding, no CVA tenderness and no tenderness at McBurney's point.  Musculoskeletal: Normal range of motion.  Lymphadenopathy:       Head (right side): Submandibular adenopathy present.       Head (left side): Submandibular adenopathy present.    She has cervical adenopathy.       Right cervical: Superficial cervical adenopathy present.       Left cervical: Superficial cervical adenopathy present.  Neurological: She is alert and oriented to person, place, and time.  Skin: Skin is warm and dry.  Psychiatric: She has a normal mood and affect. Her behavior is normal.       Results for orders placed in visit on 01/25/14  POCT CBC      Result Value Ref Range   WBC 4.9  4.6 - 10.2 K/uL   Lymph, poc 0.9  0.6 - 3.4   POC LYMPH PERCENT 18.6  10 - 50 %L   MID (cbc) 0.2  0 - 0.9   POC MID % 5.0  0 - 12 %M   POC Granulocyte 3.7  2 - 6.9   Granulocyte percent 76.4  37 - 80 %G   RBC 4.97  4.04 - 5.48 M/uL   Hemoglobin 14.1  12.2 - 16.2 g/dL   HCT, POC 16.1  09.6 - 47.9 %   MCV 88.0  80 - 97 fL   MCH, POC 28.4  27 - 31.2 pg   MCHC 32.3  31.8 - 35.4 g/dL   RDW, POC 04.5     Platelet Count, POC 204  142 - 424 K/uL   MPV 12.7  0 - 99.8 fL  POCT URINALYSIS DIPSTICK      Result Value Ref Range   Color, UA yellow     Clarity, UA clear     Glucose, UA neg     Bilirubin, UA neg     Ketones, UA 40     Spec Grav, UA >=1.030     Blood, UA mod     pH, UA 5.5     Protein, UA 30     Urobilinogen, UA 0.2     Nitrite, UA neg     Leukocytes, UA Negative    POCT UA - MICROSCOPIC ONLY       Result Value Ref Range   WBC, Ur, HPF, POC 0-1     RBC, urine, microscopic 5-8     Bacteria, U Microscopic trace     Mucus, UA neg     Epithelial cells, urine per micros 0-2     Crystals, Ur, HPF, POC neg     Casts, Ur, LPF, POC neg     Yeast,  UA neg     Assessment & Plan:  Fever, unspecified - Plan: POCT CBC, POCT urinalysis dipstick, POCT UA - Microscopic Only, Comprehensive metabolic panel  Acute viral syndrome - labs reassuring and exam non-specific. Given 2L IVF in office today for dehydration. Treat symptomatically and rest. RTC if not sig improved in 3d for further eval.  Flu-like symptoms  Meds ordered this encounter  Medications  . ondansetron (ZOFRAN-ODT) 8 MG disintegrating tablet    Sig: Take 1 tablet (8 mg total) by mouth every 8 (eight) hours as needed for nausea.    Dispense:  30 tablet    Refill:  0  . chlorpheniramine-HYDROcodone (TUSSIONEX PENNKINETIC ER) 10-8 MG/5ML LQCR    Sig: Take 5 mLs by mouth every 12 (twelve) hours as needed.    Dispense:  120 mL    Refill:  0    I personally performed the services described in this documentation, which was scribed in my presence. The recorded information has been reviewed and considered, and addended by me as needed.  Norberto SorensonEva Briarrose Shor, MD MPH

## 2014-01-25 NOTE — Patient Instructions (Signed)
I would expect that you will continue to get better - make sure you rest a lot over the weekend and take the medication so you can drink plenty of fluids. If you are not feeling better by Sunday, make sure you come back to see [place].  Viral Infections A viral infection can be caused by different types of viruses.Most viral infections are not serious and resolve on their own. However, some infections may cause severe symptoms and may lead to further complications. SYMPTOMS Viruses can frequently cause:  Minor sore throat.  Aches and pains.  Headaches.  Runny nose.  Different types of rashes.  Watery eyes.  Tiredness.  Cough.  Loss of appetite.  Gastrointestinal infections, resulting in nausea, vomiting, and diarrhea. These symptoms do not respond to antibiotics because the infection is not caused by bacteria. However, you might catch a bacterial infection following the viral infection. This is sometimes called a "superinfection." Symptoms of such a bacterial infection may include:  Worsening sore throat with pus and difficulty swallowing.  Swollen neck glands.  Chills and a high or persistent fever.  Severe headache.  Tenderness over the sinuses.  Persistent overall ill feeling (malaise), muscle aches, and tiredness (fatigue).  Persistent cough.  Yellow, green, or brown mucus production with coughing. HOME CARE INSTRUCTIONS   Only take over-the-counter or prescription medicines for pain, discomfort, diarrhea, or fever as directed by your caregiver.  Drink enough water and fluids to keep your urine clear or pale yellow. Sports drinks can provide valuable electrolytes, sugars, and hydration.  Get plenty of rest and maintain proper nutrition. Soups and broths with crackers or rice are fine. SEEK IMMEDIATE MEDICAL CARE IF:   You have severe headaches, shortness of breath, chest pain, neck pain, or an unusual rash.  You have uncontrolled vomiting, diarrhea, or you are  unable to keep down fluids.  You or your child has an oral temperature above 102 F (38.9 C), not controlled by medicine.  Your baby is older than 3 months with a rectal temperature of 102 F (38.9 C) or higher.  Your baby is 21 months old or younger with a rectal temperature of 100.4 F (38 C) or higher. MAKE SURE YOU:   Understand these instructions.  Will watch your condition.  Will get help right away if you are not doing well or get worse. Document Released: 05/27/2005 Document Revised: 11/09/2011 Document Reviewed: 12/22/2010 [facility] Patient Information 2014 [place], [place].

## 2014-01-26 LAB — COMPREHENSIVE METABOLIC PANEL
ALT: 21 U/L (ref 0–35)
AST: 23 U/L (ref 0–37)
Albumin: 4.2 g/dL (ref 3.5–5.2)
Alkaline Phosphatase: 67 U/L (ref 39–117)
BUN: 10 mg/dL (ref 6–23)
CO2: 25 mEq/L (ref 19–32)
Calcium: 8.9 mg/dL (ref 8.4–10.5)
Chloride: 99 mEq/L (ref 96–112)
Creat: 0.78 mg/dL (ref 0.50–1.10)
Glucose, Bld: 102 mg/dL — ABNORMAL HIGH (ref 70–99)
Potassium: 3.5 mEq/L (ref 3.5–5.3)
Sodium: 135 mEq/L (ref 135–145)
Total Bilirubin: 0.4 mg/dL (ref 0.2–1.2)
Total Protein: 7.2 g/dL (ref 6.0–8.3)

## 2014-01-29 ENCOUNTER — Encounter: Payer: Self-pay | Admitting: Family Medicine

## 2014-02-24 ENCOUNTER — Ambulatory Visit (INDEPENDENT_AMBULATORY_CARE_PROVIDER_SITE_OTHER): Payer: 59 | Admitting: Family Medicine

## 2014-02-24 VITALS — BP 110/70 | HR 73 | Temp 98.5°F | Resp 16 | Ht 65.0 in | Wt 154.0 lb

## 2014-02-24 DIAGNOSIS — K0889 Other specified disorders of teeth and supporting structures: Secondary | ICD-10-CM

## 2014-02-24 DIAGNOSIS — K089 Disorder of teeth and supporting structures, unspecified: Secondary | ICD-10-CM

## 2014-02-24 MED ORDER — HYDROCODONE-ACETAMINOPHEN 5-325 MG PO TABS: 1.0000 | ORAL_TABLET | Freq: Four times a day (QID) | ORAL | Status: DC | PRN

## 2014-02-24 MED ORDER — CEFDINIR 300 MG PO CAPS: 300.0000 mg | ORAL_CAPSULE | Freq: Two times a day (BID) | ORAL | Status: DC

## 2014-02-24 NOTE — Patient Instructions (Signed)
Wisdom Teeth Wisdom teeth are the last set of molars to grow in at the back of the mouth. A wisdom tooth can grow in each of the four back areas of the mouth, the:  Top right.  Top left.  Bottom right .  Bottom left. The 4 molars usually appear during later adolescence, between the ages of 7717 and 6025. A small percentage of people are born with 1 or more missing wisdom teeth or have none at all. Wisdom teeth may become painful if:  There is not enough room in the mouth for them to emerge.  They are misaligned.  Infection develops. With proper alignment and adequate space, healthy wisdom teeth can be an asset. CAUSES Often there is not enough room in the back of the jaw for the wisdom teeth. They can become trapped inside the gum (impacted), They may also grow sideways or only partially erupt. SYMPTOMS The presence of wisdom teeth or impacted wisdom teeth may not cause any symptoms. However, problems with 1 or more wisdom teeth may result in:  Pain.  Swelling around the tooth.  Stiff jaw.  General feeling of illness.  Inability to fully open your mouth (trimus).  Bad breath or unpleasant taste in your mouth. Impacted teeth may increase the risk of:  Infection.  Damage to adjacent teeth.  Growth of cysts (formed when the sac surrounding the impaced tooth becomes filled with fluid and enlarges). DIAGNOSIS  Oral exam.  X-rays. TREATMENT Your dentist or oral surgeon will recommend the best course of action for you. This may include surgical removal (extraction) of the wisdom teeth. Extraction is generally recommended to avoid complications. Removal and recovery time is easier if done in early adulthood since the roots of the teeth are smaller at this time and surrounding bone is softer. Sometimes your dentist or oral surgeon may recommend extraction before symptoms develop. This is done to avoid a more painful or complicated extraction at a later date. HOME CARE  INSTRUCTIONS  Follow up with your caregiver as directed.  If your wisdom teeth are causing pain or other symptoms:  Only take over-the-counter or prescription medicines for pain, discomfort, or fever as directed by your caregiver.  Ice the affected area for 20 minutes at a time, 3 to 4 times per day.Place a towel on the skin over the painful area and the ice or cold pack over the towel. Do not place ice directly on the skin.  Eat soft foods or a liquid diet.   Mild or moderate fevers generally have no long-term effects and often do not require treatment. PROGNOSIS Most patients recover fully from tooth extraction with proper care and pain management.  SEEK DENTAL DENTAL CARE IF :  Pain emerges, worsens, or is not controlled by the medicine you have been given.  Bleeding occurs. SEEK IMMEDIATE DENTAL CARE IF:  You have a fever or persistent symptoms for more than 72 hours.  You have a fever and your symptoms suddenly get worse. FOR MORE INFORMATION  American Dental Association https://www.choi-stevens.org/http://www.ada.org/http://www.ada.org/  American Association of Oral and Maxillofacial Surgeons http://www.aaoms.org/http://www.aaoms.org/ MAKE SURE YOU  Understand these instructions.  Will watch your condition.  Will get help right away if you are not doing well or get worse. Document Released: 09/19/2010 Document Revised: 11/09/2011 Document Reviewed: 09/19/2010 [facility] Patient Information 2015 [place], [place]. This information is not intended to replace advice given to you by your health care provider. Make sure you discuss any questions you have with your health care  provider.  

## 2014-02-24 NOTE — Progress Notes (Signed)
The chart was scribed for Elvina SidleKurt Lauenstein, MD, by Yevette EdwardsAngela Bracken, ED Scribe. This patient's care was started at 3:50 PM.   Patient ID: Tonya Arnold MRN: [id number], DOB: 1986-06-15, 28 y.o. Date of Encounter: 02/24/2014, 3:49 PM  Primary Physician: No PCP Per Patient  Chief Complaint:  Chief Complaint  Patient presents with   Dental Pain    x 1 day     HPI: 28 y.o. year old female with history below presents with lower right-sided dental pain which began yesterday. She characterizes the pain as "out of this world," and she states the pain is increased with eating and swallowing. Ms. Dian SituCrittenden also reports associated right-sided otalgia.    Past Medical History  Diagnosis Date   No pertinent past medical history      Home Meds: Prior to Admission medications   Not on File    Allergies:  Allergies  Allergen Reactions   Amoxicillin Rash    Pt states she is not allergic to penicillin or any other antibiotic    History   Social History   Marital Status: Married    Spouse Name: N/A    Number of Children: N/A   Years of Education: N/A   Occupational History   Not on file.   Social History Main Topics   Smoking status: Never Smoker    Smokeless tobacco: Never Used   Alcohol Use: No   Drug Use: No   Sexual Activity: Yes   Other Topics Concern   Not on file   Social History Narrative   No narrative on file     Review of Systems: Constitutional: negative for chills, fever, night sweats, weight changes, or fatigue  HEENT: positive for right-sided dental pain and right-sided otalgia; negative for vision changes, hearing loss, congestion, rhinorrhea, ST, epistaxis, or sinus pressure Cardiovascular: negative for chest pain or palpitations Respiratory: negative for hemoptysis, wheezing, shortness of breath, or cough Abdominal: negative for abdominal pain, nausea, vomiting, diarrhea, or constipation Dermatological: negative for  rash Neurologic: negative for headache, dizziness, or syncope All other systems reviewed and are otherwise negative with the exception to those above and in the HPI.   Physical Exam: Triage Vitals: Blood pressure 110/70, pulse 73, temperature 98.5 F (36.9 C), temperature source Oral, resp. rate 16, height 5\' 5"  (1.651 m), weight 154 lb (69.854 kg), last menstrual period 02/22/2014, SpO2 100.00%., Body mass index is 25.63 kg/(m^2).  General: Well developed, well nourished, in no acute distress. Head: Normocephalic, atraumatic, eyes without discharge, sclera non-icteric, nares are without discharge. Bilateral auditory canals clear, TM's are without perforation, pearly grey and translucent with reflective cone of light bilaterally. Oral cavity moist, posterior pharynx without exudate, erythema, peritonsillar abscess, or post nasal drip. Redness to tooth number 32.   Neck: Supple. No thyromegaly. Full ROM. No lymphadenopathy. Lungs: Clear bilaterally to auscultation without wheezes, rales, or rhonchi. Breathing is unlabored. Heart: RRR with S1 S2. No murmurs, rubs, or gallops appreciated. Abdomen: Soft, non-tender, non-distended with normoactive bowel sounds. No hepatomegaly. No rebound/guarding. No obvious abdominal masses. Msk:  Strength and tone normal for age. Extremities/Skin: Warm and dry. No clubbing or cyanosis. No edema. No rashes or suspicious lesions. Neuro: Alert and oriented X 3. Moves all extremities spontaneously. Gait is normal. CNII-XII grossly in tact. Psych:  Responds to questions appropriately with a normal affect.    ASSESSMENT AND PLAN:  28 y.o. year old female with Pain, dental - Plan: HYDROcodone-acetaminophen (NORCO) 5-325 MG per tablet, cefdinir (OMNICEF) 300  MG capsule     Signed, Elvina SidleKurt Lauenstein, MD 02/24/2014 3:49 PM

## 2014-05-19 ENCOUNTER — Ambulatory Visit (INDEPENDENT_AMBULATORY_CARE_PROVIDER_SITE_OTHER): Payer: 59 | Admitting: Internal Medicine

## 2014-05-19 VITALS — BP 104/58 | HR 85 | Temp 98.8°F | Resp 12 | Ht 64.25 in | Wt 153.0 lb

## 2014-05-19 DIAGNOSIS — Z719 Counseling, unspecified: Secondary | ICD-10-CM

## 2014-05-19 DIAGNOSIS — Z7189 Other specified counseling: Secondary | ICD-10-CM

## 2014-05-19 DIAGNOSIS — J069 Acute upper respiratory infection, unspecified: Secondary | ICD-10-CM

## 2014-05-19 NOTE — Patient Instructions (Addendum)
Immunization Schedule, Adult  Influenza vaccine.  All adults should be immunized every year.  All adults, including pregnant women and people with hives-only allergy to eggs can receive the inactivated influenza (IIV) vaccine.  Adults aged 29-49 years can receive the recombinant influenza (RIV) vaccine. The RIV vaccine does not contain any egg protein.  Adults aged 76 years or older can receive the standard-dose IIV or the high-dose IIV.  Tetanus, diphtheria, and acellular pertussis (Td, Tdap) vaccine.  Pregnant women should receive 1 dose of Tdap vaccine during each pregnancy. The dose should be obtained regardless of the length of time since the last dose. Immunization is preferred during the 27th to 36th week of gestation.  An adult who has not previously received Tdap or who does not know his or her vaccine status should receive 1 dose of Tdap. This initial dose should be followed by tetanus and diphtheria toxoids (Td) booster doses every 10 years.  Adults with an unknown or incomplete history of completing a 3-dose immunization series with Td-containing vaccines should begin or complete a primary immunization series including a Tdap dose.  Adults should receive a Td booster every 10 years.  Varicella vaccine.  An adult without evidence of immunity to varicella should receive 2 doses or a second dose if he or she has previously received 1 dose.  Pregnant females who do not have evidence of immunity should receive the first dose after pregnancy. This first dose should be obtained before leaving the health care facility. The second dose should be obtained 4-8 weeks after the first dose.  Human papillomavirus (HPV) vaccine.  Females aged 13-26 years who have not received the vaccine previously should obtain the 3-dose series.  The vaccine is not recommended for use in pregnant females. However, pregnancy testing is not needed before receiving a dose. If a female is found to be  pregnant after receiving a dose, no treatment is needed. In that case, the remaining doses should be delayed until after the pregnancy.  Males aged 37-21 years who have not received the vaccine previously should receive the 3-dose series. Males aged 22-26 years may be immunized.  Immunization is recommended through the age of 93 years for any female who has sex with males and did not get any or all doses earlier.  Immunization is recommended for any person with an immunocompromised condition through the age of 71 years if he or she did not get any or all doses earlier.  During the 3-dose series, the second dose should be obtained 4-8 weeks after the first dose. The third dose should be obtained 24 weeks after the first dose and 16 weeks after the second dose.  Zoster vaccine.  One dose is recommended for adults aged 73 years or older unless certain conditions are present.  Measles, mumps, and rubella (MMR) vaccine.  Adults born before 35 generally are considered immune to measles and mumps.  Adults born in [birth year] or later should have 1 or more doses of MMR vaccine unless there is a contraindication to the vaccine or there is laboratory evidence of immunity to each of the three diseases.  A routine second dose of MMR vaccine should be obtained at least 28 days after the first dose for students attending postsecondary schools, health care workers, or international travelers.  People who received inactivated measles vaccine or an unknown type of measles vaccine during 1963-1967 should receive 2 doses of MMR vaccine.  People who received inactivated mumps vaccine or an unknown type  of mumps vaccine before 1979 and are at high risk for mumps infection should consider immunization with 2 doses of MMR vaccine.  For females of childbearing age, rubella immunity should be determined. If there is no evidence of immunity, females who are not pregnant should be vaccinated. If there is no evidence of  immunity, females who are pregnant should delay immunization until after pregnancy.  Unvaccinated health care workers born before 1957 who lack laboratory evidence of measles, mumps, or rubella immunity or laboratory confirmation of disease should consider measles and mumps immunization with 2 doses of MMR vaccine or rubella immunization with 1 dose of MMR vaccine.  Pneumococcal 13-valent conjugate (PCV13) vaccine.  When indicated, a person who is uncertain of his or her immunization history and has no record of immunization should receive the PCV13 vaccine.  An adult aged 19 years or older who has certain medical conditions and has not been previously immunized should receive 1 dose of PCV13 vaccine. This PCV13 should be followed with a dose of pneumococcal polysaccharide (PPSV23) vaccine. The PPSV23 vaccine dose should be obtained at least 8 weeks after the dose of PCV13 vaccine.  An adult aged 19 years or older who has certain medical conditions and previously received 1 or more doses of PPSV23 vaccine should receive 1 dose of PCV13. The PCV13 vaccine dose should be obtained 1 or more years after the last PPSV23 vaccine dose.  Pneumococcal polysaccharide (PPSV23) vaccine.  When PCV13 is also indicated, PCV13 should be obtained first.  All adults aged 65 years and older should be immunized.  An adult younger than age 65 years who has certain medical conditions should be immunized.  Any person who resides in a nursing home or long-term care facility should be immunized.  An adult smoker should be immunized.  People with an immunocompromised condition and certain other conditions should receive both PCV13 and PPSV23 vaccines.  People with human immunodeficiency virus (HIV) infection should be immunized as soon as possible after diagnosis.  Immunization during chemotherapy or radiation therapy should be avoided.  Routine use of PPSV23 vaccine is not recommended for American Indians,  [place] Natives, or people younger than 65 years unless there are medical conditions that require PPSV23 vaccine.  When indicated, people who have unknown immunization and have no record of immunization should receive PPSV23 vaccine.  One-time revaccination 5 years after the first dose of PPSV23 is recommended for people aged 19-64 years who have chronic kidney failure, nephrotic syndrome, asplenia, or immunocompromised conditions.  People who received 1-2 doses of PPSV23 before age 65 years should receive another dose of PPSV23 vaccine at age 65 years or later if at least 5 years have passed since the previous dose.  Doses of PPSV23 are not needed for people immunized with PPSV23 at or after age 65 years.  Meningococcal vaccine.  Adults with asplenia or persistent complement component deficiencies should receive 2 doses of quadrivalent meningococcal conjugate (MenACWY-D) vaccine. The doses should be obtained at least 2 months apart.  Microbiologists working with certain meningococcal bacteria, military recruits, people at risk during an outbreak, and people who travel to or live in countries with a high rate of meningitis should be immunized.  A first-year college student up through age 21 years who is living in a residence hall should receive a dose if he or she did not receive a dose on or after his or her 16th birthday.  Adults who have certain high-risk conditions should receive one or more doses   of vaccine.  Hepatitis A vaccine.  Adults who wish to be protected from this disease, have certain high-risk conditions, work with hepatitis A-infected animals, work in hepatitis A research labs, or travel to or work in countries with a high rate of hepatitis A should be immunized.  Adults who were previously unvaccinated and who anticipate close contact with an international adoptee during the first 60 days after arrival in the [place] from a country with a high rate of hepatitis A should  be immunized.  Hepatitis B vaccine.  Adults who wish to be protected from this disease, have certain high-risk conditions, may be exposed to blood or other infectious body fluids, are household contacts or sex partners of hepatitis B positive people, are clients or workers in certain care facilities, or travel to or work in countries with a high rate of hepatitis B should be immunized.  Haemophilus influenzae type b (Hib) vaccine.  A previously unvaccinated person with asplenia or sickle cell disease or having a scheduled splenectomy should receive 1 dose of Hib vaccine.  Regardless of previous immunization, a recipient of a hematopoietic stem cell transplant should receive a 3-dose series 6-12 months after his or her successful transplant.  Hib vaccine is not recommended for adults with HIV infection. Document Released: 11/07/2003 Document Revised: 12/12/2012 Document Reviewed: 10/04/2012 [employer] Patient Information 2015 [employer]. This information is not intended to replace advice given to you by your health care provider. Make sure you discuss any questions you have with your health care provider.  

## 2014-05-19 NOTE — Progress Notes (Signed)
   Subjective:    Patient ID: Tonya Arnold, female    DOB: 1986/02/24, 28 y.o.   MRN: [id number]  HPI A 28 year old female is here regarding cold symptoms. She explains the symptoms have been ongoing for about 2 days now.  Patient states her symptoms consist of congestion, slight headache, sneezing, minor coughing.  She denies having sore throat, fever, chills or any production of phlegm. Patient states she has not been taking any OTC meds to help with cold symptoms.  Her symptoms have not interfered with her daily activities, food intake or sleep patterns.  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   Review of Systems     Objective:   Physical Exam  Constitutional: She is oriented to person, place, and time. She appears well-developed and well-nourished.  HENT:  Head: Normocephalic.  Right Ear: External ear normal.  Left Ear: External ear normal.  Nose: Mucosal edema and rhinorrhea present. No sinus tenderness. Right sinus exhibits no maxillary sinus tenderness and no frontal sinus tenderness. Left sinus exhibits no maxillary sinus tenderness and no frontal sinus tenderness.  Mouth/Throat: Oropharynx is clear and moist.  Eyes: Conjunctivae and EOM are normal. Pupils are equal, round, and reactive to light.  Neck: Normal range of motion. Neck supple.  Cardiovascular: Normal rate, regular rhythm and normal heart sounds.   Pulmonary/Chest: Effort normal and breath sounds normal.  Abdominal: Soft. Bowel sounds are normal.  Musculoskeletal: Normal range of motion.  Neurological: She is alert and oriented to person, place, and time. No cranial nerve deficit. She exhibits normal muscle tone. Coordination normal.  Psychiatric: She has a normal mood and affect. Her behavior is normal. Judgment and thought content normal.          Assessment & Plan:  OK for work URI/symptomatic care.

## 2014-07-15 ENCOUNTER — Ambulatory Visit (INDEPENDENT_AMBULATORY_CARE_PROVIDER_SITE_OTHER): Payer: 59 | Admitting: Family Medicine

## 2014-07-15 VITALS — BP 110/70 | HR 78 | Temp 98.3°F | Resp 16 | Ht 65.25 in | Wt 150.4 lb

## 2014-07-15 DIAGNOSIS — R5383 Other fatigue: Secondary | ICD-10-CM

## 2014-07-15 DIAGNOSIS — H109 Unspecified conjunctivitis: Secondary | ICD-10-CM

## 2014-07-15 LAB — POCT CBC
Granulocyte percent: 58.2 %G (ref 37–80)
HCT, POC: 42.8 % (ref 37.7–47.9)
Hemoglobin: 13.9 g/dL (ref 12.2–16.2)
Lymph, poc: 2.5 (ref 0.6–3.4)
MCH, POC: 28.2 pg (ref 27–31.2)
MCHC: 32.5 g/dL (ref 31.8–35.4)
MCV: 87 fL (ref 80–97)
MID (cbc): 0.5 (ref 0–0.9)
MPV: 9.5 fL (ref 0–99.8)
POC Granulocyte: 4.2 (ref 2–6.9)
POC LYMPH PERCENT: 35.2 %L (ref 10–50)
POC MID %: 6.6 %M (ref 0–12)
Platelet Count, POC: 216 10*3/uL (ref 142–424)
RBC: 4.92 M/uL (ref 4.04–5.48)
RDW, POC: 13.3 %
WBC: 7.2 10*3/uL (ref 4.6–10.2)

## 2014-07-15 LAB — COMPLETE METABOLIC PANEL WITH GFR
ALT: 15 U/L (ref 0–35)
AST: 18 U/L (ref 0–37)
Albumin: 4.5 g/dL (ref 3.5–5.2)
Alkaline Phosphatase: 63 U/L (ref 39–117)
BUN: 16 mg/dL (ref 6–23)
CO2: 24 mEq/L (ref 19–32)
Calcium: 9.3 mg/dL (ref 8.4–10.5)
Chloride: 103 mEq/L (ref 96–112)
Creat: 0.7 mg/dL (ref 0.50–1.10)
GFR, Est African American: >89 mL/min
GFR, Est Non African American: >89 mL/min
Glucose, Bld: 88 mg/dL (ref 70–99)
Potassium: 4.2 mEq/L (ref 3.5–5.3)
Sodium: 136 mEq/L (ref 135–145)
Total Bilirubin: 0.7 mg/dL (ref 0.2–1.2)
Total Protein: 7.3 g/dL (ref 6.0–8.3)

## 2014-07-15 LAB — TSH: TSH: 2.583 u[IU]/mL (ref 0.350–4.500)

## 2014-07-15 MED ORDER — TOBRAMYCIN 0.3 % OP SOLN: 1.0000 [drp] | OPHTHALMIC | Status: DC

## 2014-07-15 NOTE — Progress Notes (Signed)
   Subjective:    Patient ID: Tonya Arnold, female    DOB: 04-06-1986, 28 y.o.   MRN: [id number]  HPI Chief Complaint  Patient presents with  . Fatigue  . Eye Problem   This chart was scribed for Elvina SidleKurt Cystal Shannahan, MD by Andrew Auaven Small, ED Scribe. This patient was seen in room 2 and the patient's care was started at 10:14 AM.  HPI Comments: Tonya MyrtleLemarys Naomi Easterwood is a 28 y.o. female who presents to the [facility] complaining of left conjunctivitis. Pt states she woke up this more with crust in her eye. Pt states she has cleaned right eye twice as well as changed her sheets and pillow case.   Pt is also c/o constant fatigue for this past two months. She reports associated of spontaneous ecchymosis and light headedness. Pt works at [employer] and reports after work she goes to sleep at 7:30 and wakes in the morning around and is still tried. She reports family hx of DM.  Past Medical History  Diagnosis Date  . No pertinent past medical history    Allergies  Allergen Reactions  . Amoxicillin Rash    Pt states she is not allergic to penicillin or any other antibiotic   Prior to Admission medications   Not on File   Review of Systems  Constitutional: Positive for fatigue.  HENT: Positive for sore throat.   Eyes: Positive for redness.  Neurological: Positive for light-headedness.  Hematological: Bruises/bleeds easily.   Objective:   Physical Exam  Constitutional: She is oriented to person, place, and time. She appears well-developed and well-nourished. No distress.  HENT:  Head: Normocephalic and atraumatic.  Eyes: EOM are normal. Left conjunctiva is injected.  Neck: Neck supple.  Cardiovascular: Normal rate.   Pulmonary/Chest: Effort normal.  Musculoskeletal: Normal range of motion.  Neurological: She is alert and oriented to person, place, and time.  Skin: Skin is warm and dry.  Faint ecchymosis on posterior tricep and arms  Psychiatric: She  has a normal mood and affect. Her behavior is normal.  Nursing note and vitals reviewed. mmother looks like the picture of health with the exception of the injected left eye.   Assessment & Plan:   1. Other fatigue   2. Conjunctivitis of left eye    Meds ordered this encounter  Medications  . tobramycin (TOBREX) 0.3 % ophthalmic solution    Sig: Place 1 drop into the left eye every 4 (four) hours.    Dispense:  5 mL    Refill:  0     I personally performed the services described in this documentation, which was scribed in my presence. The recorded information has been reviewed and is accurate.  Elvina SidleKurt Ilya Ess, MD

## 2014-07-15 NOTE — Patient Instructions (Signed)
Bacterial Conjunctivitis Bacterial conjunctivitis, commonly called pink eye, is an inflammation of the clear membrane that covers the white part of the eye (conjunctiva). The inflammation can also happen on the underside of the eyelids. The blood vessels in the conjunctiva become inflamed, causing the eye to become red or pink. Bacterial conjunctivitis may spread easily from one eye to another and from person to person (contagious).  CAUSES  Bacterial conjunctivitis is caused by bacteria. The bacteria may come from your own skin, your upper respiratory tract, or from someone else with bacterial conjunctivitis. SYMPTOMS  The normally white color of the eye or the underside of the eyelid is usually pink or red. The pink eye is usually associated with irritation, tearing, and some sensitivity to light. Bacterial conjunctivitis is often associated with a thick, yellowish discharge from the eye. The discharge may turn into a crust on the eyelids overnight, which causes your eyelids to stick together. If a discharge is present, there may also be some blurred vision in the affected eye. DIAGNOSIS  Bacterial conjunctivitis is diagnosed by your caregiver through an eye exam and the symptoms that you report. Your caregiver looks for changes in the surface tissues of your eyes, which may point to the specific type of conjunctivitis. A sample of any discharge may be collected on a cotton-tip swab if you have a severe case of conjunctivitis, if your cornea is affected, or if you keep getting repeat infections that do not respond to treatment. The sample will be sent to a lab to see if the inflammation is caused by a bacterial infection and to see if the infection will respond to antibiotic medicines. TREATMENT   Bacterial conjunctivitis is treated with antibiotics. Antibiotic eyedrops are most often used. However, antibiotic ointments are also available. Antibiotics pills are sometimes used. Artificial tears or eye  washes may ease discomfort. HOME CARE INSTRUCTIONS   To ease discomfort, apply a cool, clean washcloth to your eye for 10-20 minutes, 3-4 times a day.  Gently wipe away any drainage from your eye with a warm, wet washcloth or a cotton ball.  Wash your hands often with soap and water. Use paper towels to dry your hands.  Do not share towels or washcloths. This may spread the infection.  Change or wash your pillowcase every day.  You should not use eye makeup until the infection is gone.  Do not operate machinery or drive if your vision is blurred.  Stop using contact lenses. Ask your caregiver how to sterilize or replace your contacts before using them again. This depends on the type of contact lenses that you use.  When applying medicine to the infected eye, do not touch the edge of your eyelid with the eyedrop bottle or ointment tube. SEEK IMMEDIATE MEDICAL CARE IF:   Your infection has not improved within 3 days after beginning treatment.  You had yellow discharge from your eye and it returns.  You have increased eye pain.  Your eye redness is spreading.  Your vision becomes blurred.  You have a fever or persistent symptoms for more than 2-3 days.  You have a fever and your symptoms suddenly get worse.  You have facial pain, redness, or swelling. MAKE SURE YOU:   Understand these instructions.  Will watch your condition.  Will get help right away if you are not doing well or get worse. Document Released: 08/17/2005 Document Revised: 01/01/2014 Document Reviewed: 01/18/2012 Exit[employer] Patient Information 2015 [employer]. This information is not intended to   replace advice given to you by your health care provider. Make sure you discuss any questions you have with your health care provider. Fatigue Fatigue is a feeling of tiredness, lack of energy, lack of motivation, or feeling tired all the time. Having enough rest, good nutrition, and reducing stress will normally  reduce fatigue. Consult your caregiver if it persists. The nature of your fatigue will help your caregiver to find out its cause. The treatment is based on the cause.  CAUSES  There are many causes for fatigue. Most of the time, fatigue can be traced to one or more of your habits or routines. Most causes fit into one or more of three general areas. They are: Lifestyle problems  Sleep disturbances.  Overwork.  Physical exertion.  Unhealthy habits.  Poor eating habits or eating disorders.  Alcohol and/or drug use .  Lack of proper nutrition (malnutrition). Psychological problems  Stress and/or anxiety problems.  Depression.  Grief.  Boredom. Medical Problems or Conditions  Anemia.  Pregnancy.  Thyroid gland problems.  Recovery from major surgery.  Continuous pain.  Emphysema or asthma that is not well controlled  Allergic conditions.  Diabetes.  Infections (such as mononucleosis).  Obesity.  Sleep disorders, such as sleep apnea.  Heart failure or other heart-related problems.  Cancer.  Kidney disease.  Liver disease.  Effects of certain medicines such as antihistamines, cough and cold remedies, prescription pain medicines, heart and blood pressure medicines, drugs used for treatment of cancer, and some antidepressants. SYMPTOMS  The symptoms of fatigue include:   Lack of energy.  Lack of drive (motivation).  Drowsiness.  Feeling of indifference to the surroundings. DIAGNOSIS  The details of how you feel help guide your caregiver in finding out what is causing the fatigue. You will be asked about your present and past health condition. It is important to review all medicines that you take, including prescription and non-prescription items. A thorough exam will be done. You will be questioned about your feelings, habits, and normal lifestyle. Your caregiver may suggest blood tests, urine tests, or other tests to look for common medical causes of  fatigue.  TREATMENT  Fatigue is treated by correcting the underlying cause. For example, if you have continuous pain or depression, treating these causes will improve how you feel. Similarly, adjusting the dose of certain medicines will help in reducing fatigue.  HOME CARE INSTRUCTIONS   Try to get the required amount of good sleep every night.  Eat a healthy and nutritious diet, and drink enough water throughout the day.  Practice ways of relaxing (including yoga or meditation).  Exercise regularly.  Make plans to change situations that cause stress. Act on those plans so that stresses decrease over time. Keep your work and personal routine reasonable.  Avoid street drugs and minimize use of alcohol.  Start taking a daily multivitamin after consulting your caregiver. SEEK MEDICAL CARE IF:   You have persistent tiredness, which cannot be accounted for.  You have fever.  You have unintentional weight loss.  You have headaches.  You have disturbed sleep throughout the night.  You are feeling sad.  You have constipation.  You have dry skin.  You have gained weight.  You are taking any new or different medicines that you suspect are causing fatigue.  You are unable to sleep at night.  You develop any unusual swelling of your legs or other parts of your body. SEEK IMMEDIATE MEDICAL CARE IF:   You are feeling confused.  Your vision is blurred.  You feel faint or pass out.  You develop severe headache.  You develop severe abdominal, pelvic, or back pain.  You develop chest pain, shortness of breath, or an irregular or fast heartbeat.  You are unable to pass a normal amount of urine.  You develop abnormal bleeding such as bleeding from the rectum or you vomit blood.  You have thoughts about harming yourself or committing suicide.  You are worried that you might harm someone else. MAKE SURE YOU:   Understand these instructions.  Will watch your  condition.  Will get help right away if you are not doing well or get worse. Document Released: 06/14/2007 Document Revised: 11/09/2011 Document Reviewed: 12/19/2013 [facility] [place] Patient Information 2015 [place], [place]. This information is not intended to replace advice given to you by your health care provider. Make sure you discuss any questions you have with your health care provider.

## 2014-07-16 LAB — EPSTEIN-BARR VIRUS VCA ANTIBODY PANEL
EBV EA IgG: <5 U/mL (ref <9.0)
EBV NA IgG: 371 U/mL — ABNORMAL HIGH (ref <18.0)
EBV VCA IgG: 81.5 U/mL — ABNORMAL HIGH (ref <18.0)
EBV VCA IgM: <10 U/mL (ref <36.0)

## 2014-09-06 ENCOUNTER — Ambulatory Visit (INDEPENDENT_AMBULATORY_CARE_PROVIDER_SITE_OTHER): Payer: 59 | Admitting: Emergency Medicine

## 2014-09-06 VITALS — BP 116/76 | HR 78 | Temp 97.9°F | Resp 18 | Ht 65.25 in | Wt 149.2 lb

## 2014-09-06 DIAGNOSIS — J0140 Acute pansinusitis, unspecified: Secondary | ICD-10-CM

## 2014-09-06 DIAGNOSIS — J209 Acute bronchitis, unspecified: Secondary | ICD-10-CM

## 2014-09-06 MED ORDER — HYDROCOD POLST-CHLORPHEN POLST 10-8 MG/5ML PO LQCR: 5.0000 mL | Freq: Two times a day (BID) | ORAL | Status: DC | PRN

## 2014-09-06 MED ORDER — PSEUDOEPHEDRINE-GUAIFENESIN ER 60-600 MG PO TB12: 1.0000 | ORAL_TABLET | Freq: Two times a day (BID) | ORAL | Status: AC

## 2014-09-06 MED ORDER — LEVOFLOXACIN 500 MG PO TABS: 500.0000 mg | ORAL_TABLET | Freq: Every day | ORAL | Status: AC

## 2014-09-06 NOTE — Progress Notes (Signed)
[facility] [address], [place] [id number] [phone]  Date:  09/06/2014   Name:  Tonya Arnold   DOB:  08-19-1986   MRN:  [id number]  PCP:  No PCP Per Patient    Chief Complaint: Emesis; Generalized Body Aches; and Fever   History of Present Illness:  Tonya MyrtleLemarys Naomi Denault is a 29 y.o. very pleasant female patient who presents with the following:  Ill for a week with nasal congestion and post nasal drainage.  Has purulent post nasal drainage and cough No wheezing or shortness of breath No nausea but post tussive vomiting.   Fever and chills. No stool change or rash No improvement with over the counter medications or other home remedies.  Denies other complaint or health concern today.    There are no active problems to display for this patient.   Past Medical History  Diagnosis Date  . No pertinent past medical history     Past Surgical History  Procedure Laterality Date  . Cesarean section    . Cesarean section  07/10/2012    Procedure: CESAREAN SECTION;  Surgeon: Mickel Baasichard D Kaplan, MD;  Location: [facility];  Service: Obstetrics;  Laterality: N/A;  Repeat cesarean section with delivery of baby girl at 1700.  Apgars 8/9.    History  Substance Use Topics  . Smoking status: Never Smoker   . Smokeless tobacco: Never Used  . Alcohol Use: No    Family History  Problem Relation Age of Onset  . Asthma Mother   . Diabetes Father   . Hypertension Father   . Kidney disease Father     Allergies  Allergen Reactions  . Amoxicillin Rash    Pt states she is not allergic to penicillin or any other antibiotic    Medication list has been reviewed and updated.  Current Outpatient Prescriptions on File Prior to Visit  Medication Sig Dispense Refill  . tobramycin (TOBREX) 0.3 % ophthalmic solution Place 1 drop into the left eye every 4 (four) hours. (Patient not taking: Reported on 09/06/2014) 5 mL 0   No current facility-administered  medications on file prior to visit.    Review of Systems:  As per HPI, otherwise negative.    Physical Examination: Filed Vitals:   09/06/14 0905  BP: 116/76  Pulse: 78  Temp: 97.9 F (36.6 C)  Resp: 18   Filed Vitals:   09/06/14 0905  Height: 5' 5.25" (1.657 m)  Weight: 149 lb 3.2 oz (67.677 kg)   Body mass index is 24.65 kg/(m^2). Ideal Body Weight: Weight in (lb) to have BMI = 25: 151.1  GEN: WDWN, NAD, Non-toxic, A & O x 3 HEENT: Atraumatic, Normocephalic. Neck supple. No masses, No LAD. Ears and Nose: No external deformity. CV: RRR, No M/G/R. No JVD. No thrill. No extra heart sounds. PULM: CTA B, no wheezes, crackles, rhonchi. No retractions. No resp. distress. No accessory muscle use. ABD: S, NT, ND, +BS. No rebound. No HSM. EXTR: No c/c/e NEURO Normal gait.  PSYCH: Normally interactive. Conversant. Not depressed or anxious appearing.  Calm demeanor.    Assessment and Plan: Sinusitis  Bronchitis levaquin mucinex  tussionex  Signed,  Phillips OdorJeffery Chatara Lucente, MD

## 2014-09-06 NOTE — Patient Instructions (Signed)

## 2014-10-07 ENCOUNTER — Ambulatory Visit (INDEPENDENT_AMBULATORY_CARE_PROVIDER_SITE_OTHER): Payer: 59 | Admitting: Family Medicine

## 2014-10-07 VITALS — BP 102/54 | HR 84 | Temp 97.9°F | Resp 18 | Ht 64.5 in | Wt 148.8 lb

## 2014-10-07 DIAGNOSIS — J40 Bronchitis, not specified as acute or chronic: Secondary | ICD-10-CM

## 2014-10-07 DIAGNOSIS — R059 Cough, unspecified: Secondary | ICD-10-CM

## 2014-10-07 DIAGNOSIS — J329 Chronic sinusitis, unspecified: Secondary | ICD-10-CM

## 2014-10-07 DIAGNOSIS — R05 Cough: Secondary | ICD-10-CM

## 2014-10-07 MED ORDER — HYDROCODONE-HOMATROPINE 5-1.5 MG/5ML PO SYRP: ORAL_SOLUTION | ORAL | Status: DC

## 2014-10-07 MED ORDER — LEVOFLOXACIN 500 MG PO TABS
500.0000 mg | ORAL_TABLET | Freq: Every day | ORAL | Status: DC
Start: 2014-10-07 — End: 2017-08-26

## 2014-10-07 NOTE — Progress Notes (Addendum)
Subjective:    Patient ID: Tonya Arnold, female    DOB: 14-Nov-1985, 29 y.o.   MRN: [id number] This chart was scribed for Tonya Staggers, MD by Jolene Provost, Medical Scribe. This patient was seen in Room 9 and the patient's care was started a 10:53 AM.  Chief Complaint  Patient presents with  . Follow-up    PATIENT WAS HERE 09/06/14 was given ABX but symtpoms have not gone away  . Sore Throat  . Cough    productive cough, green phlegm    HPI HPI Comments: Tonya Arnold is a 29 y.o. female who presents to [facility] complaining of continuing cough productive of green sputum with associated sore throat that started more than a month ago. Pt states her sx got somewhat better after she was seen last time, but never fully resolved. Pt states her vomiting and fever have completely resolved, but pt states that her productive cough and congestion have continued. Pt states her green sputum resolved after her last visit, but recurred two das ago. Pt also endorses current associated rhinorrhea, chest pain, sleep disturbance and sinus pressure. Pt states she has used flonase and salt water nasal spray without relief. Pt states she is allergic to amoxicillin. Pt states she was admitted two years ago to the ED for severe sinus infection, and was given morphine in the ED due to pain. Pt states her child had infant asthma.  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  Notes from Last HPI: Pt was seen one months ago by Dr. Dareen Piano with a one week hx of nasal congestion and cough. Pt was treated for sinusitis and bronchitis with Levaquin  QD for ten days, mucinex D, and tussionex for cough.    There are no active problems to display for this patient.  Past Medical History  Diagnosis Date  . No pertinent past medical history    Past Surgical History  Procedure Laterality Date  . Cesarean section    . Cesarean section  07/10/2012    Procedure: CESAREAN SECTION;   Surgeon: Mickel Baas, MD;  Location: [facility];  Service: Obstetrics;  Laterality: N/A;  Repeat cesarean section with delivery of baby girl at 1700.  Apgars 8/9.   Allergies  Allergen Reactions  . Amoxicillin Rash    Pt states she is not allergic to penicillin or any other antibiotic   Prior to Admission medications   Medication Sig Start Date End Date Taking? Authorizing Provider  pseudoephedrine-guaifenesin (MUCINEX D) 60-600 MG per tablet Take 1 tablet by mouth every 12 (twelve) hours. 09/06/14 09/06/15 Yes Carmelina Dane, MD  chlorpheniramine-HYDROcodone (TUSSIONEX PENNKINETIC ER) 10-8 MG/5ML LQCR Take 5 mLs by mouth every 12 (twelve) hours as needed. Patient not taking: Reported on 10/07/2014 09/06/14   Carmelina Dane, MD  tobramycin (TOBREX) 0.3 % ophthalmic solution Place 1 drop into the left eye every 4 (four) hours. Patient not taking: Reported on 09/06/2014 07/15/14   Elvina Sidle, MD   History   Social History  . Marital Status: Married    Spouse Name: N/A    Number of Children: N/A  . Years of Education: N/A   Occupational History  . Not on file.   Social History Main Topics  . Smoking status: Never Smoker   . Smokeless tobacco: Never Used  . Alcohol Use: No  . Drug Use: No  . Sexual Activity: Yes   Other Topics Concern  . Not on file  Social History Narrative     Review of Systems  Constitutional: Negative for fever and chills.  HENT: Positive for congestion, nosebleeds, rhinorrhea, sinus pressure and sore throat.   Respiratory: Positive for cough.        Chest pain.  Gastrointestinal: Negative for vomiting.  Psychiatric/Behavioral: Positive for sleep disturbance (Due to cough).       Objective:   Physical Exam  Constitutional: She is oriented to person, place, and time. She appears well-developed and well-nourished. No distress.  HENT:  Head: Normocephalic and atraumatic.  Right Ear: Hearing, tympanic membrane, external ear and ear canal  normal.  Left Ear: Hearing, tympanic membrane, external ear and ear canal normal.  Nose: Nose normal.  Mouth/Throat: Oropharynx is clear and moist. No oropharyngeal exudate.  Slight maxillary sinus tenderness, right greater than left. Edematous turbinates bilaterally with raw appearance.   Eyes: Conjunctivae and EOM are normal. Pupils are equal, round, and reactive to light.  Neck: Neck supple.  Cardiovascular: Normal rate, regular rhythm, normal heart sounds and intact distal pulses.   No murmur heard. Pulmonary/Chest: Effort normal and breath sounds normal. No respiratory distress. She has no wheezes. She has no rhonchi.  Neurological: She is alert and oriented to person, place, and time.  Skin: Skin is warm and dry. No rash noted.  Psychiatric: She has a normal mood and affect. Her behavior is normal.  Nursing note and vitals reviewed.   Filed Vitals:   10/07/14 1033  BP: 102/54  Pulse: 84  Temp: 97.9 F (36.6 C)  TempSrc: Oral  Resp: 18  Height: 5' 4.5" (1.638 m)  Weight: 148 lb 12.8 oz (67.495 kg)  SpO2: 100%       Assessment & Plan:   Tonya MyrtleLemarys Naomi Lourenco is a 29 y.o. female Sinobronchitis - Plan: levofloxacin (LEVAQUIN) 500 MG tablet  Cough - Plan: levofloxacin (LEVAQUIN) 500 MG tablet  -sx care with mucinex, saline ns and will treat again with Levaquin- 10 day course as worsening cough, but more likely maxillary sinusitis. If not improving after this course, may need ENT eval or CT sinuses. rtc sooner if worse.   Meds ordered this encounter  Medications  . levofloxacin (LEVAQUIN) 500 MG tablet    Sig: Take 1 tablet (500 mg total) by mouth daily.    Dispense:  10 tablet    Refill:  0   Patient Instructions  Saline nasal spray atleast 4 times per day, over the counter mucinex or mucinex DM, drink plenty of fluids. We can try one more round of Levaquin for possible sinus infection, but if not improving with this treatment, may need further evaluation with ENT  specialist or imaging. Return to the clinic or go to the nearest emergency room if any of your symptoms worsen or new symptoms occur.  Sinusitis Sinusitis is redness, soreness, and inflammation of the paranasal sinuses. Paranasal sinuses are air pockets within the bones of your face (beneath the eyes, the middle of the forehead, or above the eyes). In healthy paranasal sinuses, mucus is able to drain out, and air is able to circulate through them by way of your nose. However, when your paranasal sinuses are inflamed, mucus and air can become trapped. This can allow bacteria and other germs to grow and cause infection. Sinusitis can develop quickly and last only a short time (acute) or continue over a long period (chronic). Sinusitis that lasts for more than 12 weeks is considered chronic.  CAUSES  Causes of sinusitis include:  Allergies.  Structural abnormalities, such as displacement of the cartilage that separates your nostrils (deviated septum), which can decrease the air flow through your nose and sinuses and affect sinus drainage.  Functional abnormalities, such as when the small hairs (cilia) that line your sinuses and help remove mucus do not work properly or are not present. SIGNS AND SYMPTOMS  Symptoms of acute and chronic sinusitis are the same. The primary symptoms are pain and pressure around the affected sinuses. Other symptoms include:  Upper toothache.  Earache.  Headache.  Bad breath.  Decreased sense of smell and taste.  A cough, which worsens when you are lying flat.  Fatigue.  Fever.  Thick drainage from your nose, which often is green and may contain pus (purulent).  Swelling and warmth over the affected sinuses. DIAGNOSIS  Your health care provider will perform a physical exam. During the exam, your health care provider may:  Look in your nose for signs of abnormal growths in your nostrils (nasal polyps).  Tap over the affected sinus to check for signs of  infection.  View the inside of your sinuses (endoscopy) using an imaging device that has a light attached (endoscope). If your health care provider suspects that you have chronic sinusitis, one or more of the following tests may be recommended:  Allergy tests.  Nasal culture. A sample of mucus is taken from your nose, sent to a lab, and screened for bacteria.  Nasal cytology. A sample of mucus is taken from your nose and examined by your health care provider to determine if your sinusitis is related to an allergy. TREATMENT  Most cases of acute sinusitis are related to a viral infection and will resolve on their own within 10 days. Sometimes medicines are prescribed to help relieve symptoms (pain medicine, decongestants, nasal steroid sprays, or saline sprays).  However, for sinusitis related to a bacterial infection, your health care provider will prescribe antibiotic medicines. These are medicines that will help kill the bacteria causing the infection.  Rarely, sinusitis is caused by a fungal infection. In theses cases, your health care provider will prescribe antifungal medicine. For some cases of chronic sinusitis, surgery is needed. Generally, these are cases in which sinusitis recurs more than 3 times per year, despite other treatments. HOME CARE INSTRUCTIONS   Drink plenty of water. Water helps thin the mucus so your sinuses can drain more easily.  Use a humidifier.  Inhale steam 3 to 4 times a day (for example, sit in the bathroom with the shower running).  Apply a warm, moist washcloth to your face 3 to 4 times a day, or as directed by your health care provider.  Use saline nasal sprays to help moisten and clean your sinuses.  Take medicines only as directed by your health care provider.  If you were prescribed either an antibiotic or antifungal medicine, finish it all even if you start to feel better. SEEK IMMEDIATE MEDICAL CARE IF:  You have increasing pain or severe  headaches.  You have nausea, vomiting, or drowsiness.  You have swelling around your face.  You have vision problems.  You have a stiff neck.  You have difficulty breathing. MAKE SURE YOU:   Understand these instructions.  Will watch your condition.  Will get help right away if you are not doing well or get worse. Document Released: 08/17/2005 Document Revised: 01/01/2014 Document Reviewed: 09/01/2011 [facility] Patient Information 2015 [place], [place]. This information is not intended to replace advice given to you by your health  care provider. Make sure you discuss any questions you have with your health care provider.     I personally performed the services described in this documentation, which was scribed in my presence. The recorded information has been reviewed and considered, and addended by me as needed.

## 2014-10-07 NOTE — Patient Instructions (Addendum)
Saline nasal spray atleast 4 times per day, over the counter mucinex or mucinex DM, drink plenty of fluids. We can try one more round of Levaquin for possible sinus infection, but if not improving with this treatment, may need further evaluation with ENT specialist or imaging. Return to the clinic or go to the nearest emergency room if any of your symptoms worsen or new symptoms occur. Hycodan at night if needed for cough, but if wheezing returns - recheck in office.   Sinusitis Sinusitis is redness, soreness, and inflammation of the paranasal sinuses. Paranasal sinuses are air pockets within the bones of your face (beneath the eyes, the middle of the forehead, or above the eyes). In healthy paranasal sinuses, mucus is able to drain out, and air is able to circulate through them by way of your nose. However, when your paranasal sinuses are inflamed, mucus and air can become trapped. This can allow bacteria and other germs to grow and cause infection. Sinusitis can develop quickly and last only a short time (acute) or continue over a long period (chronic). Sinusitis that lasts for more than 12 weeks is considered chronic.  CAUSES  Causes of sinusitis include:  Allergies.  Structural abnormalities, such as displacement of the cartilage that separates your nostrils (deviated septum), which can decrease the air flow through your nose and sinuses and affect sinus drainage.  Functional abnormalities, such as when the small hairs (cilia) that line your sinuses and help remove mucus do not work properly or are not present. SIGNS AND SYMPTOMS  Symptoms of acute and chronic sinusitis are the same. The primary symptoms are pain and pressure around the affected sinuses. Other symptoms include:  Upper toothache.  Earache.  Headache.  Bad breath.  Decreased sense of smell and taste.  A cough, which worsens when you are lying flat.  Fatigue.  Fever.  Thick drainage from your nose, which often is  green and may contain pus (purulent).  Swelling and warmth over the affected sinuses. DIAGNOSIS  Your health care provider will perform a physical exam. During the exam, your health care provider may:  Look in your nose for signs of abnormal growths in your nostrils (nasal polyps).  Tap over the affected sinus to check for signs of infection.  View the inside of your sinuses (endoscopy) using an imaging device that has a light attached (endoscope). If your health care provider suspects that you have chronic sinusitis, one or more of the following tests may be recommended:  Allergy tests.  Nasal culture. A sample of mucus is taken from your nose, sent to a lab, and screened for bacteria.  Nasal cytology. A sample of mucus is taken from your nose and examined by your health care provider to determine if your sinusitis is related to an allergy. TREATMENT  Most cases of acute sinusitis are related to a viral infection and will resolve on their own within 10 days. Sometimes medicines are prescribed to help relieve symptoms (pain medicine, decongestants, nasal steroid sprays, or saline sprays).  However, for sinusitis related to a bacterial infection, your health care provider will prescribe antibiotic medicines. These are medicines that will help kill the bacteria causing the infection.  Rarely, sinusitis is caused by a fungal infection. In theses cases, your health care provider will prescribe antifungal medicine. For some cases of chronic sinusitis, surgery is needed. Generally, these are cases in which sinusitis recurs more than 3 times per year, despite other treatments. HOME CARE INSTRUCTIONS   Drink  plenty of water. Water helps thin the mucus so your sinuses can drain more easily.  Use a humidifier.  Inhale steam 3 to 4 times a day (for example, sit in the bathroom with the shower running).  Apply a warm, moist washcloth to your face 3 to 4 times a day, or as directed by your health  care provider.  Use saline nasal sprays to help moisten and clean your sinuses.  Take medicines only as directed by your health care provider.  If you were prescribed either an antibiotic or antifungal medicine, finish it all even if you start to feel better. SEEK IMMEDIATE MEDICAL CARE IF:  You have increasing pain or severe headaches.  You have nausea, vomiting, or drowsiness.  You have swelling around your face.  You have vision problems.  You have a stiff neck.  You have difficulty breathing. MAKE SURE YOU:   Understand these instructions.  Will watch your condition.  Will get help right away if you are not doing well or get worse. Document Released: 08/17/2005 Document Revised: 01/01/2014 Document Reviewed: 09/01/2011 [facility] Patient Information 2015 [place], [place]. This information is not intended to replace advice given to you by your health care provider. Make sure you discuss any questions you have with your health care provider.

## 2015-12-05 LAB — HM PAP SMEAR: HM Pap smear: NEGATIVE

## 2016-11-02 ENCOUNTER — Ambulatory Visit
Admission: RE | Admit: 2016-11-02 | Discharge: 2016-11-02 | Disposition: A | Discharge status: Home / Self Care | Payer: 59 | Source: Ambulatory Visit | Attending: Internal Medicine | Admitting: Internal Medicine

## 2016-11-02 ENCOUNTER — Other Ambulatory Visit: Payer: Self-pay | Admitting: Internal Medicine

## 2016-11-02 DIAGNOSIS — R52 Pain, unspecified: Secondary | ICD-10-CM

## 2017-08-25 ENCOUNTER — Encounter: Payer: Self-pay | Admitting: Neurology

## 2017-08-26 ENCOUNTER — Encounter: Payer: Self-pay | Admitting: Neurology

## 2017-08-26 ENCOUNTER — Ambulatory Visit (INDEPENDENT_AMBULATORY_CARE_PROVIDER_SITE_OTHER): Payer: 59 | Admitting: Neurology

## 2017-08-26 VITALS — BP 109/72 | HR 69 | Ht 64.0 in | Wt 153.0 lb

## 2017-08-26 DIAGNOSIS — R233 Spontaneous ecchymoses: Secondary | ICD-10-CM

## 2017-08-26 DIAGNOSIS — G2581 Restless legs syndrome: Secondary | ICD-10-CM | POA: Diagnosis not present

## 2017-08-26 NOTE — Progress Notes (Signed)
SLEEP MEDICINE CLINIC   Provider:  Melvyn Novasarmen  [name], [name]  Primary Care Physician:  Referring Provider: Renford DillsPolite, Ronald, MD  Chief Complaint  Patient presents with  . New Patient (Initial Visit)    pt alone, rm 10 pt is having constant pain and bruising on her legs, pain on joints. pt eyes have twitched and headaches. she has had night sweats. decreased energy and anxiety. pt has also struggled with restlessness in legs.     HPI:  Tonya Arnold is a 31 y.o. female , seen here in a referral from Dr. Nehemiah SettlePolite for RLS, waking up with bruising on both legs in the morning. Feeling daytime sleey and tired.  She also reports an increased frequency of headaches, her headaches increase in the later day towards bedtime but are not usually present in the morning.  Other concern is right eyelid twitching affecting the upper eyelid. Her legs are painful to the point where she can no longer stand the pain. She has none of these bruises today.  She feels under pressure and under stress at work, and this has increased her level of anxiety.   Sleep habits are as follows: Her bedtime goal is 10 PM, but most nights she is in bed not before 11:30 PM.  Takes her longer because of pain in her legs.  She describes the pain as a soreness and achiness, not associated with a lot of charley horses, she does not describe radiation or electric shock sensations.  It is not a burning sensation- it is a deep pain-the spasm in form of a charley horse is present a few times per months 3-4 nights.  The upper leg pain described as soreness and achiness is present 2-3 weeks at a time but she is currently almost 4 weeks symptom-free. She reports no nightmares, no PLMs. - sometimes she sleep talks.  She sleeps on 2 pillows -her bedroom is cool, quiet and dark.  According to her husband she snores only when she had a very exhausting day.  He has not noticed any abnormal sleep behavior- she used to sleep walk in childhood ! In the  6 years of her marriage she has only walked in her sleep one time. She does wake up once a month with her sheets wet from sweat. Would be very early for menopause. She is not on hormone treatment, BCP.   Sleep medical history and family sleep history:  Sleep walking. Mom kept multiple locks on the door, [place].   Social history:  Married, 2 daughters, 5 and 5711. Non smoker, ETOH, none, caffeine intake 1-2 cups in AM, no tea, Soda or energy drinks.  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  The patient reports a high level of anxiety and stress but she does not feel depressed. Her leg pain has been qualified above, she is unsure if she kicks or moves a lot at night. The bruising was not precipitated by an intake of medication such as antiplatelet medication aspirin, ibuprofen, Voltaren. Epworth score 12 , Fatigue severity score 30  , depression score 0/15   Social History   Socioeconomic History  . Marital status: Married    Spouse name: Not on file  . Number of children: Not on file  . Years of education: Not on file  .  Highest education level: Not on file  Social Needs  . Financial resource strain: Not on file  . Food insecurity - worry: Not on file  . Food insecurity - inability: Not on file  . Transportation needs - medical: Not on file  . Transportation needs - non-medical: Not on file  Occupational History  . Not on file  Tobacco Use  . Smoking status: Never Smoker  . Smokeless tobacco: Never Used  Substance and Sexual Activity  . Alcohol use: No  . Drug use: No  . Sexual activity: Yes  Other Topics Concern  . Not on file  Social History Narrative  . Not on file    Family History  Problem Relation Age of Onset  . Asthma Mother   . Diabetes Father   . Hypertension Father   . Kidney  disease Father   . Heart disease Father     Past Medical History:  Diagnosis Date  . No pertinent past medical history     Past Surgical History:  Procedure Laterality Date  . CESAREAN SECTION    . CESAREAN SECTION  07/10/2012   Procedure: CESAREAN SECTION;  Surgeon: Mickel Baasichard D Kaplan, MD;  Location: [facility];  Service: Obstetrics;  Laterality: N/A;  Repeat cesarean section with delivery of baby girl at 1700.  Apgars 8/9.    No current outpatient medications on file.   No current facility-administered medications for this visit.     Allergies as of 08/26/2017 - Review Complete 08/26/2017  Allergen Reaction Noted  . Amoxicillin Rash 11/23/2011    Vitals: BP 109/72   Pulse 69   Ht 5\' 4"  (1.626 m)   Wt 153 lb (69.4 kg)   BMI 26.26 kg/m  Last Weight:  Wt Readings from Last 1 Encounters:  08/26/17 153 lb (69.4 kg)   ZOX:WRUEBMI:Body mass index is 26.26 kg/m.     Last Height:   Ht Readings from Last 1 Encounters:  08/26/17 5\' 4"  (1.626 m)    Physical exam:  General: The patient is awake, alert and appears not in acute distress. The patient is well groomed. Head: Normocephalic, atraumatic. Neck is supple. Mallampati 1,  neck circumference: 13. Nasal airflow patent ,  Cardiovascular:  Regular rate and rhythm , without  murmurs or carotid bruit, and without distended neck veins. Respiratory: Lungs are clear to auscultation. Skin:  Without evidence of edema, or rash- none today Trunk: BMI is 26 -  Neurologic exam :  Attention span & concentration ability appears normal.  Speech is fluent,  without  dysarthria, dysphonia or aphasia.  Mood and affect are appropriate.  Cranial nerves: Pupils are equal and briskly reactive to light.  Extraocular movements  in vertical and horizontal planes intact and without nystagmus. Visual fields by finger perimetry are intact.Hearing to finger rub intact.  Facial sensation intact to fine touch. Facial motor strength is symmetric and tongue and  uvula move midline. Shoulder shrug was symmetrical.   Motor exam: Normal tone, muscle bulk and symmetric strength in all extremities. Sensory:  Fine touch, pinprick and vibration were tested in all extremities. Proprioception tested in the upper extremities was normal. Coordination: Rapid alternating movements in the fingers/hands was normal. Finger-to-nose maneuver  normal without evidence of ataxia, dysmetria or tremor. Gait and station: Patient walks without assistive device .Tandem gait is unfragmented. Turns with  3 Steps. Romberg testing is negative. Deep tendon reflexes: in the  upper and lower extremities are symmetric and intact.  Babinski maneuver response is downgoing.  I reviewed Dr. Idelle Crouch last office notes which speak of non-intractable headaches, unspecified chronicity unspecified type, the patient had normal vital signs at her last visit she is actually slightly hypotensive with a blood pressure of 90/60.  BMI was lower than 26, normal normal temperature at the time he last evaluated her he thought that she had an upper respiratory tract infection.  She developed nausea and vomiting after being started on doxycycline.  I have no evidence that she took any medication or had any dietary changes that may have precipitated a tendency to bruise easier- Patient certainly does not recall having taken over-the-counter medication, not on fish oil or vitamin supplements every day.  Since her body pains and her bruising are not present right now I would like for her to keep a diary.  I would also appreciate if she could take a picture of a bruise so that we have an idea of how large these are and if they look as if they are petechial or deeper in the tissue.  I do have wondered if the patient may have sleepwalking episodes or is acting out dreams, thus may be injuring herself without her knowledge. I would like her husband to have a special eye on that.  I will order a CBC with platelets, and  asked the patient to be careful with nonsteroidal anti-inflammatory medications.  Tylenol would be allowed.  As to the restless legs we could try a medication but usually we do not design a sleep study around restless legs.  Assessment:  After physical and neurologic examination, review of laboratory studies as far as provided in visit.- my assessment is :   1)  Tendency to bruise, unexplained. Night sweats - unexplained. Platelet count  ordered   2)  Irresistible urge to move her legs- RLS.  Get CBC and diff with platelets. History of iron deficiency anemia.   3)  eyelid tic seems to be benign, no diplopia and no facial draw, no numbness.   The patient was advised of the nature of the diagnosed disorder, the treatment options and the  risks for general health and wellness arising from not treating the condition.   I spent more than 45 minutes of face to face time with the patient.  Greater than 50% of time was spent in counseling and coordination of care. We have discussed the diagnosis and differential and I answered the patient's questions.    Plan:  Treatment plan and additional workup :  Please investigate symptoms with PCP If platelet count and iron metabolism are normal. RV prn.    Melvyn Novas, MD 08/26/2017, 2:13 PM  Certified in Neurology by ABPN Certified in Sleep Medicine by [facility] [address] [place], [place] [address]

## 2017-08-27 ENCOUNTER — Telehealth: Payer: Self-pay | Admitting: Neurology

## 2017-08-27 LAB — CBC WITH DIFFERENTIAL/PLATELET
Basophils Absolute: 0 10*3/uL (ref 0.0–0.2)
Basos: 0 %
EOS (ABSOLUTE): 0 10*3/uL (ref 0.0–0.4)
Eos: 1 %
Hematocrit: 41.8 % (ref 34.0–46.6)
Hemoglobin: 13.8 g/dL (ref 11.1–15.9)
Immature Grans (Abs): 0 10*3/uL (ref 0.0–0.1)
Immature Granulocytes: 0 %
Lymphocytes Absolute: 2.2 10*3/uL (ref 0.7–3.1)
Lymphs: 27 %
MCH: 28.5 pg (ref 26.6–33.0)
MCHC: 33 g/dL (ref 31.5–35.7)
MCV: 86 fL (ref 79–97)
Monocytes Absolute: 0.5 10*3/uL (ref 0.1–0.9)
Monocytes: 6 %
Neutrophils Absolute: 5.4 10*3/uL (ref 1.4–7.0)
Neutrophils: 66 %
Platelets: 243 10*3/uL (ref 150–379)
RBC: 4.84 x10E6/uL (ref 3.77–5.28)
RDW: 13.7 % (ref 12.3–15.4)
WBC: 8.1 10*3/uL (ref 3.4–10.8)

## 2017-08-27 LAB — FERRITIN: Ferritin: 43 ng/mL (ref 15–150)

## 2017-08-27 NOTE — Telephone Encounter (Signed)
Called the patient and made her aware of these results. Pt verbalized understanding.

## 2017-08-27 NOTE — Telephone Encounter (Signed)
-----   Message from Melvyn Novasarmen Dohmeier, MD sent at 08/27/2017 10:51 AM EST ----- Normal number of platelets. Other tests still pending.

## 2018-06-13 ENCOUNTER — Ambulatory Visit (INDEPENDENT_AMBULATORY_CARE_PROVIDER_SITE_OTHER): Payer: 59 | Admitting: Physician Assistant

## 2018-06-13 ENCOUNTER — Ambulatory Visit (INDEPENDENT_AMBULATORY_CARE_PROVIDER_SITE_OTHER): Payer: 59

## 2018-06-13 ENCOUNTER — Other Ambulatory Visit: Payer: Self-pay

## 2018-06-13 ENCOUNTER — Encounter

## 2018-06-13 ENCOUNTER — Encounter: Payer: Self-pay | Admitting: Physician Assistant

## 2018-06-13 VITALS — BP 106/69 | HR 80 | Temp 98.3°F | Resp 18 | Ht 64.0 in | Wt 155.2 lb

## 2018-06-13 DIAGNOSIS — R1032 Left lower quadrant pain: Secondary | ICD-10-CM

## 2018-06-13 DIAGNOSIS — R0602 Shortness of breath: Secondary | ICD-10-CM | POA: Diagnosis not present

## 2018-06-13 DIAGNOSIS — R5383 Other fatigue: Secondary | ICD-10-CM | POA: Diagnosis not present

## 2018-06-13 DIAGNOSIS — G8929 Other chronic pain: Secondary | ICD-10-CM | POA: Diagnosis not present

## 2018-06-13 DIAGNOSIS — R42 Dizziness and giddiness: Secondary | ICD-10-CM

## 2018-06-13 DIAGNOSIS — Z7689 Persons encountering health services in other specified circumstances: Secondary | ICD-10-CM

## 2018-06-13 LAB — POCT URINALYSIS DIP (MANUAL ENTRY)
Bilirubin, UA: NEGATIVE
Blood, UA: NEGATIVE
Glucose, UA: NEGATIVE mg/dL
Ketones, POC UA: NEGATIVE mg/dL
Leukocytes, UA: NEGATIVE
Nitrite, UA: NEGATIVE
Protein Ur, POC: NEGATIVE mg/dL
Spec Grav, UA: 1.02 (ref 1.010–1.025)
Urobilinogen, UA: 0.2 E.U./dL
pH, UA: 7 (ref 5.0–8.0)

## 2018-06-13 LAB — POCT URINE PREGNANCY: Preg Test, Ur: NEGATIVE

## 2018-06-13 NOTE — Patient Instructions (Addendum)
All of your labs today were reassuring. We have collected additional ones and should have those results back within 4 days. I recommend increasing the amount of water you are drinking to at least 64 oz. If all labs are normal and your symptoms persist, please return to office. If your abdominal pain returns, please seek care as we may need to do further imaging.   Thank you for letting me participate in your health and well being.   If you have lab work done today you will be contacted with your lab results within the next 2 weeks.  If you have not heard from [place] then please contact us. The fastest way to get your results is to register for My Chart.   Shortness of Breath, Adult Shortness of breath means you have trouble breathing. Your lungs are organs for breathing. Follow these instructions at home: Pay attention to any changes in your symptoms. Take these actions to help with your condition:  Do not smoke. Smoking can cause shortness of breath. If you need help to quit smoking, ask your doctor.  Avoid things that can make it harder to breathe, such as: ? Mold. ? Dust. ? Air pollution. ? Chemical smells. ? Things that can cause allergy symptoms (allergens), if you have allergies.  Keep your living space clean and free of mold and dust.  Rest as needed. Slowly return to your usual activities.  Take over-the-counter and prescription medicines, including oxygen and inhaled medicines, only as told by your doctor.  Keep all follow-up visits as told by your doctor. This is important.  Contact a doctor if:  Your condition does not get better as soon as expected.  You have a hard time doing your normal activities, even after you rest.  You have new symptoms. Get help right away if:  You have trouble breathing when you are resting.  You feel light-headed or you faint.  You have a cough that is not helped by medicines.  You cough up blood.  You have pain with breathing.  You  have pain in your chest, arms, shoulders, or belly (abdomen).  You have a fever.  You cannot walk up stairs.  You cannot exercise the way you normally do. This information is not intended to replace advice given to you by your health care provider. Make sure you discuss any questions you have with your health care provider. Document Released: 02/03/2008 Document Revised: 09/03/2016 Document Reviewed: 09/03/2016 Elsevier Interactive Patient Education  2017 [employer] Inc. Dizziness Dizziness is a common problem. It makes you feel unsteady or light-headed. You may feel like you are about to pass out (faint). Dizziness can lead to getting hurt if you stumble or fall. Dizziness can be caused by many things, including:  Medicines.  Not having enough water in your body (dehydration).  Illness.  Follow these instructions at home: Eating and drinking  Drink enough fluid to keep your pee (urine) clear or pale yellow. This helps to keep you from getting dehydrated. Try to drink more clear fluids, such as water.  Do not drink alcohol.  Limit how much caffeine you drink or eat, if your doctor tells you to do that.  Limit how much salt (sodium) you drink or eat, if your doctor tells you to do that. Activity  Avoid making quick movements. ? When you stand up from sitting in a chair, steady yourself until you feel okay. ? In the morning, first sit up on the side of the bed.  When you feel okay, stand slowly while you hold onto something. Do this until you know that your balance is fine.  If you need to stand in one place for a long time, move your legs often. Tighten and relax the muscles in your legs while you are standing.  Do not drive or use heavy machinery if you feel dizzy.  Avoid bending down if you feel dizzy. Place items in your home so you can reach them easily without leaning over. Lifestyle  Do not use any products that contain nicotine or tobacco, such as cigarettes and  e-cigarettes. If you need help quitting, ask your doctor.  Try to lower your stress level. You can do this by using methods such as yoga or meditation. Talk with your doctor if you need help. General instructions  Watch your dizziness for any changes.  Take over-the-counter and prescription medicines only as told by your doctor. Talk with your doctor if you think that you are dizzy because of a medicine that you are taking.  Tell a friend or a family member that you are feeling dizzy. If he or she notices any changes in your behavior, have this person call your doctor.  Keep all follow-up visits as told by your doctor. This is important. Contact a doctor if:  Your dizziness does not go away.  Your dizziness or light-headedness gets worse.  You feel sick to your stomach (nauseous).  You have trouble hearing.  You have new symptoms.  You are unsteady on your feet.  You feel like the room is spinning. Get help right away if:  You throw up (vomit) or have watery poop (diarrhea), and you cannot eat or drink anything.  You have trouble: ? Talking. ? Walking. ? Swallowing. ? Using your arms, hands, or legs.  You feel generally weak.  You are not thinking clearly, or you have trouble forming sentences. A friend or family member may notice this.  You have: ? Chest pain. ? Pain in your belly (abdomen). ? Shortness of breath. ? Sweating.  Your vision changes.  You are bleeding.  You have a very bad headache.  You have neck pain or a stiff neck.  You have a fever. These symptoms may be an emergency. Do not wait to see if the symptoms will go away. Get medical help right away. Call your local emergency services (911 in the [place] Do not drive yourself to the hospital. Summary  Dizziness makes you feel unsteady or light-headed. You may feel like you are about to pass out (faint).  Drink enough fluid to keep your pee (urine) clear or pale yellow. Do not drink  alcohol.  Avoid making quick movements if you feel dizzy.  Watch your dizziness for any changes. This information is not intended to replace advice given to you by your health care provider. Make sure you discuss any questions you have with your health care provider. Document Released: 08/06/2011 Document Revised: 09/03/2016 Document Reviewed: 09/03/2016 Elsevier Interactive Patient Education  2017 Elsevier Inc.  Living With Anxiety After being diagnosed with an anxiety disorder, you may be relieved to know why you have felt or behaved a certain way. It is natural to also feel overwhelmed about the treatment ahead and what it will mean for your life. With care and support, you can manage this condition and recover from it. How to cope with anxiety Dealing with stress Stress is your body's reaction to life changes and events, both good and bad. Stress  can last just a few hours or it can be ongoing. Stress can play a major role in anxiety, so it is important to learn both how to cope with stress and how to think about it differently. Talk with your health care provider or a counselor to learn more about stress reduction. He or she may suggest some stress reduction techniques, such as:  Music therapy. This can include creating or listening to music that you enjoy and that inspires you.  Mindfulness-based meditation. This involves being aware of your normal breaths, rather than trying to control your breathing. It can be done while sitting or walking.  Centering prayer. This is a kind of meditation that involves focusing on a word, phrase, or sacred image that is meaningful to you and that brings you peace.  Deep breathing. To do this, expand your stomach and inhale slowly through your nose. Hold your breath for 3-5 seconds. Then exhale slowly, allowing your stomach muscles to relax.  Self-talk. This is a skill where you identify thought patterns that lead to anxiety reactions and correct those  thoughts.  Muscle relaxation. This involves tensing muscles then relaxing them.  Choose a stress reduction technique that fits your lifestyle and personality. Stress reduction techniques take time and practice. Set aside 5-15 minutes a day to do them. Therapists can offer training in these techniques. The training may be covered by some insurance plans. Other things you can do to manage stress include:  Keeping a stress diary. This can help you learn what triggers your stress and ways to control your response.  Thinking about how you respond to certain situations. You may not be able to control everything, but you can control your reaction.  Making time for activities that help you relax, and not feeling guilty about spending your time in this way.  Therapy combined with coping and stress-reduction skills provides the best chance for successful treatment. Medicines Medicines can help ease symptoms. Medicines for anxiety include:  Anti-anxiety drugs.  Antidepressants.  Beta-blockers.  Medicines may be used as the main treatment for anxiety disorder, along with therapy, or if other treatments are not working. Medicines should be prescribed by a health care provider. Relationships Relationships can play a big part in helping you recover. Try to spend more time connecting with trusted friends and family members. Consider going to couples counseling, taking family education classes, or going to family therapy. Therapy can help you and others better understand the condition. How to recognize changes in your condition Everyone has a different response to treatment for anxiety. Recovery from anxiety happens when symptoms decrease and stop interfering with your daily activities at home or work. This may mean that you will start to:  Have better concentration and focus.  Sleep better.  Be less irritable.  Have more energy.  Have improved memory.  It is important to recognize when your  condition is getting worse. Contact your health care provider if your symptoms interfere with home or work and you do not feel like your condition is improving. Where to find help and support: You can get help and support from these sources:  Self-help groups.  Online and [employer].  A trusted spiritual leader.  Couples counseling.  Family education classes.  Family therapy.  Follow these instructions at home:  Eat a healthy diet that includes plenty of vegetables, fruits, whole grains, low-fat dairy products, and lean protein. Do not eat a lot of foods that are high in solid fats, added  sugars, or salt.  Exercise. Most adults should do the following: ? Exercise for at least 150 minutes each week. The exercise should increase your heart rate and make you sweat (moderate-intensity exercise). ? Strengthening exercises at least twice a week.  Cut down on caffeine, tobacco, alcohol, and other potentially harmful substances.  Get the right amount and quality of sleep. Most adults need 7-9 hours of sleep each night.  Make choices that simplify your life.  Take over-the-counter and prescription medicines only as told by your health care provider.  Avoid caffeine, alcohol, and certain over-the-counter cold medicines. These may make you feel worse. Ask your pharmacist which medicines to avoid.  Keep all follow-up visits as told by your health care provider. This is important. Questions to ask your health care provider  Would I benefit from therapy?  How often should I follow up with a health care provider?  How long do I need to take medicine?  Are there any long-term side effects of my medicine?  Are there any alternatives to taking medicine? Contact a health care provider if:  You have a hard time staying focused or finishing daily tasks.  You spend many hours a day feeling worried about everyday life.  You become exhausted by worry.  You start to have  headaches, feel tense, or have nausea.  You urinate more than normal.  You have diarrhea. Get help right away if:  You have a racing heart and shortness of breath.  You have thoughts of hurting yourself or others. If you ever feel like you may hurt yourself or others, or have thoughts about taking your own life, get help right away. You can go to your nearest emergency department or call:  Your local emergency services (911 in the [place]  A suicide crisis helpline, such as the National Suicide Prevention Lifeline at 325-095-2847. This is open 24-hours a day.  Summary  Taking steps to deal with stress can help calm you.  Medicines cannot cure anxiety disorders, but they can help ease symptoms.  Family, friends, and partners can play a big part in helping you recover from an anxiety disorder. This information is not intended to replace advice given to you by your health care provider. Make sure you discuss any questions you have with your health care provider. Document Released: 08/11/2016 Document Revised: 08/11/2016 Document Reviewed: 08/11/2016 Elsevier Interactive Patient Education  2018 [employer].  IF you received an x-ray today, you will receive an invoice from [facility]. Please contact [facility] at [phone] with questions or concerns regarding your invoice.   IF you received labwork today, you will receive an invoice from [facility]. Please contact [facility] at [phone] with questions or concerns regarding your invoice.   Our billing staff will not be able to assist you with questions regarding bills from these companies.  You will be contacted with the lab results as soon as they are available. The fastest way to get your results is to activate your My Chart account. Instructions are located on the last page of this paperwork. If you have not heard from [place] regarding the results in 2 weeks, please contact this office.

## 2018-06-13 NOTE — Progress Notes (Signed)
Tonya Arnold  MRN: [id number] DOB: February 11, 1986  Subjective:  Tonya Arnold is a 32 y.o. female seen in office today for a chief complaint of intermittent SOB, dizziness, and nausea x 1.5 months. Happens weekly. Lasts anywhere from minutes to hours. Can happen if sitting or moving around. SOB feels like she cannot get a deep breath. Dizziness feels like she is spinning. Intermittent LLQ ab pain x 3 months. Has had this pain in the past years ago and been evaluated by OB/Gyn, Dr. Deatra Ina,  but  Feels sharp in nature and will go away after 15 minutes. No associated sx. No relation to food or BM. Has BM daily. Denies vomiting, visual disturbance, hearing loss, tinnitus,  DOE, chest pain, heart palpitations, orthopnea, lower leg swelling,   No recent surgery/travel/immobilization, hx of cancer, leg swelling, hemoptysis, prior DVT/PE, or hormone use.  LMP 05/25/18. No birth control. She is sexually active with monogamous partner.   Diet: drinks maybe 2 bottles of water a day. Eats more red meat than white meat. Likes carbs. Some fruits and veggies. No current exercise. Sleeps 6-7 hours a night.   Denies tobacco, alcohol, and ilicit drug use.   No PMH of asthma, COPD, heart disease, diabetes, thyroid disease, or kidney disease.    Review of Systems  Constitutional: Positive for fatigue. Negative for chills, diaphoresis, fever and unexpected weight change.  Respiratory: Negative for cough and wheezing.   Cardiovascular: Negative for chest pain, palpitations and leg swelling.  Gastrointestinal: Negative for blood in stool, constipation and diarrhea.  Endocrine: Positive for cold intolerance. Negative for heat intolerance, polydipsia, polyphagia and polyuria.       Positive for hair thinning.   Genitourinary: Negative for dysuria, flank pain, frequency, hematuria, vaginal discharge and vaginal pain.  Musculoskeletal: Negative for neck pain.  Skin: Negative for rash.    Neurological: Negative for tremors, seizures, syncope, speech difficulty, weakness, numbness and headaches.  Psychiatric/Behavioral: Negative for confusion, decreased concentration, dysphoric mood and self-injury.    Patient Active Problem List   Diagnosis Date Noted  . Bruising, spontaneous 08/26/2017  . RLS (restless legs syndrome) 08/26/2017    No current outpatient medications on file prior to visit.   No current facility-administered medications on file prior to visit.     Allergies  Allergen Reactions  . Amoxicillin Rash    Pt states she is not allergic to penicillin or any other antibiotic      Social History   Socioeconomic History  . Marital status: Married    Spouse name: Not on file  . Number of children: Not on file  . Years of education: Not on file  . Highest education level: Not on file  Occupational History  . Not on file  Social Needs  . Financial resource strain: Not on file  . Food insecurity:    Worry: Not on file    Inability: Not on file  . Transportation needs:    Medical: Not on file    Non-medical: Not on file  Tobacco Use  . Smoking status: Never Smoker  . Smokeless tobacco: Never Used  Substance and Sexual Activity  . Alcohol use: No  . Drug use: No  . Sexual activity: Yes  Lifestyle  . Physical activity:    Days per week: Not on file    Minutes per session: Not on file  . Stress: Not on file  Relationships  . Social connections:    Talks on phone: Not on file  Gets together: Not on file    Attends religious service: Not on file    Active member of club or organization: Not on file    Attends meetings of clubs or organizations: Not on file    Relationship status: Not on file  . Intimate partner violence:    Fear of current or ex partner: Not on file    Emotionally abused: Not on file    Physically abused: Not on file    Forced sexual activity: Not on file  Other Topics Concern  . Not on file  Social History Narrative  .  Not on file    Objective:  BP 106/69   Pulse 80   Temp 98.3 F (36.8 C) (Oral)   Resp 18   Ht _0  (1.626 m)   Wt 155 lb 3.2 oz (70.4 kg)   LMP 05/25/2018   SpO2 99%   BMI 26.64 kg/m   Physical Exam  Constitutional: She is oriented to person, place, and time. She appears well-developed and well-nourished. No distress.  HENT:  Head: Normocephalic and atraumatic.  Mouth/Throat: Uvula is midline, oropharynx is clear and moist and mucous membranes are normal. No tonsillar exudate.  Eyes: Pupils are equal, round, and reactive to light. Conjunctivae and EOM are normal.  Fundoscopic exam:      The right eye shows no hemorrhage and no papilledema. The right eye shows red reflex.       The left eye shows no hemorrhage and no papilledema. The left eye shows red reflex.  Neck: Normal range of motion and full passive range of motion without pain. No Brudzinski's sign and no Kernig's sign noted.  Cardiovascular: Normal rate, regular rhythm, normal heart sounds and intact distal pulses.  Pulmonary/Chest: Effort normal and breath sounds normal. No accessory muscle usage. No tachypnea. She has no decreased breath sounds. She has no wheezes. She has no rhonchi. She has no rales.  Abdominal: Soft. Normal appearance and bowel sounds are normal. There is no hepatosplenomegaly. There is no tenderness. There is no rigidity, no guarding, no tenderness at McBurney's point and negative Murphy's sign. No hernia.  Genitourinary: Uterus normal. Uterus is not deviated, not enlarged, not fixed and not tender. Cervix exhibits no motion tenderness and no friability. Right adnexum displays no mass, no tenderness and no fullness. Left adnexum displays no mass, no tenderness and no fullness.  Genitourinary Comments: Chaperone present for GU exam  Musculoskeletal:       Right lower leg: She exhibits no swelling.       Left lower leg: She exhibits no swelling.  Neurological: She is alert and oriented to person, place,  and time. She has normal strength and normal reflexes. No cranial nerve deficit or sensory deficit. She displays a negative Romberg sign. Gait normal.  Normal FNF and HTS test.  Normal Tandem walk.  Neg Dix Hallpike  Skin: Skin is warm and dry.  Psychiatric: She has a normal mood and affect.  Vitals reviewed.    Depression screen PHQ 2/9 06/13/2018  Decreased Interest 0  Down, Depressed, Hopeless 0  PHQ - 2 Score 0   GAD 7 : Generalized Anxiety Score 06/13/2018  Nervous, Anxious, on Edge 0  Control/stop worrying 2  Worry too much - different things 2  Trouble relaxing 2  Restless 0  Easily annoyed or irritable 0  Afraid - awful might happen 1  Total GAD 7 Score 7   Orthostatic VS for the past 24 hrs:  BP-  Lying Pulse- Lying BP- Sitting Pulse- Sitting BP- Standing at 0 minutes Pulse- Standing at 0 minutes  06/13/18 1454 106/68 69 114/74 78 106/73 84    Dg Chest 2 View  Result Date: 06/13/2018 CLINICAL DATA:  Intermittent shortness of breath for several weeks EXAM: CHEST - 2 VIEW COMPARISON:  None. FINDINGS: The heart size and mediastinal contours are within normal limits. Both lungs are clear. The visualized skeletal structures are unremarkable. IMPRESSION: No active cardiopulmonary disease. Electronically Signed   By: Inez Catalina M.D.   On: 06/13/2018 14:45    Results for orders placed or performed in visit on 06/13/18 (from the past 24 hour(s))  POCT urine pregnancy     Status: None   Collection Time: 06/13/18  2:33 PM  Result Value Ref Range   Preg Test, Ur Negative Negative  POCT urinalysis dipstick     Status: None   Collection Time: 06/13/18  2:37 PM  Result Value Ref Range   Color, UA yellow yellow   Clarity, UA clear clear   Glucose, UA negative negative mg/dL   Bilirubin, UA negative negative   Ketones, POC UA negative negative mg/dL   Spec Grav, UA 1.020 1.010 - 1.025   Blood, UA negative negative   pH, UA 7.0 5.0 - 8.0   Protein Ur, POC negative negative  mg/dL   Urobilinogen, UA 0.2 0.2 or 1.0 E.U./dL   Nitrite, UA Negative Negative   Leukocytes, UA Negative Negative    EKG shows NSR with rate of 70 BPM. PR and QRS intervals within normal limits. No prior ekg for comparison.    Assessment and Plan :  1. SOB (shortness of breath) - currently completely asymptomatic.  Perhaps anxiety.  No red flags.  No concerns today.  EKG and CXR and PE all WNL.  Labs pending.  Discussed life stressors with patient.   - CMP14+EGFR - EKG 12-Lead - DG Chest 2 View; Future  2. Dizziness - feel to be potentially psychosomatic.  Checking basic labs.  Increase oral hydration b/c not drinking much during the day.  Nothing concerning on exam or by history.  - EKG 12-Lead - Orthostatic vital signs  3. Other fatigue Labs as below.  See SOB above -- possibly psychosomatic.  No concerning findings.  - CBC with Differential/Platelet - Lipid panel - TSH  4. Chronic LLQ pain - completely resolved and asx.  Brought this up more as "FYI."  Can repeat prior vag [place] if recurs or KUB.  Normal abdominal and GU exam without any tenderness today.  - POCT urine pregnancy - POCT urinalysis dipstick  5. Encounter to establish care   Tenna Delaine PA-C  [facility] [place] 06/13/2018 3:01 PM

## 2018-06-14 LAB — CBC WITH DIFFERENTIAL/PLATELET
Basophils Absolute: 0 10*3/uL (ref 0.0–0.2)
Basos: 1 %
EOS (ABSOLUTE): 0.1 10*3/uL (ref 0.0–0.4)
Eos: 1 %
Hematocrit: 39 % (ref 34.0–46.6)
Hemoglobin: 13.1 g/dL (ref 11.1–15.9)
Immature Grans (Abs): 0 10*3/uL (ref 0.0–0.1)
Immature Granulocytes: 0 %
Lymphocytes Absolute: 2 10*3/uL (ref 0.7–3.1)
Lymphs: 30 %
MCH: 28.7 pg (ref 26.6–33.0)
MCHC: 33.6 g/dL (ref 31.5–35.7)
MCV: 86 fL (ref 79–97)
Monocytes Absolute: 0.6 10*3/uL (ref 0.1–0.9)
Monocytes: 8 %
Neutrophils Absolute: 4.1 10*3/uL (ref 1.4–7.0)
Neutrophils: 60 %
Platelets: 228 10*3/uL (ref 150–450)
RBC: 4.56 x10E6/uL (ref 3.77–5.28)
RDW: 12.5 % (ref 12.3–15.4)
WBC: 6.7 10*3/uL (ref 3.4–10.8)

## 2018-06-14 LAB — CMP14+EGFR
ALT: 19 IU/L (ref 0–32)
AST: 21 IU/L (ref 0–40)
Albumin/Globulin Ratio: 2 (ref 1.2–2.2)
Albumin: 4.5 g/dL (ref 3.5–5.5)
Alkaline Phosphatase: 59 IU/L (ref 39–117)
BUN/Creatinine Ratio: 12 (ref 9–23)
BUN: 10 mg/dL (ref 6–20)
Bilirubin Total: 0.8 mg/dL (ref 0.0–1.2)
CO2: 22 mmol/L (ref 20–29)
Calcium: 9.3 mg/dL (ref 8.7–10.2)
Chloride: 105 mmol/L (ref 96–106)
Creatinine, Ser: 0.81 mg/dL (ref 0.57–1.00)
GFR calc Af Amer: 110 mL/min/{1.73_m2} (ref >59)
GFR calc non Af Amer: 96 mL/min/{1.73_m2} (ref >59)
Globulin, Total: 2.3 g/dL (ref 1.5–4.5)
Glucose: 83 mg/dL (ref 65–99)
Potassium: 4.2 mmol/L (ref 3.5–5.2)
Sodium: 143 mmol/L (ref 134–144)
Total Protein: 6.8 g/dL (ref 6.0–8.5)

## 2018-06-14 LAB — LIPID PANEL
Chol/HDL Ratio: 3.2 ratio (ref 0.0–4.4)
Cholesterol, Total: 190 mg/dL (ref 100–199)
HDL: 59 mg/dL (ref >39)
LDL Calculated: 102 mg/dL — ABNORMAL HIGH (ref 0–99)
Triglycerides: 145 mg/dL (ref 0–149)
VLDL Cholesterol Cal: 29 mg/dL (ref 5–40)

## 2018-06-14 LAB — TSH: TSH: 2.12 u[IU]/mL (ref 0.450–4.500)

## 2018-06-15 ENCOUNTER — Encounter: Payer: Self-pay | Admitting: Radiology

## 2018-07-18 ENCOUNTER — Encounter: Payer: Self-pay | Admitting: Physician Assistant

## 2018-07-18 ENCOUNTER — Ambulatory Visit (INDEPENDENT_AMBULATORY_CARE_PROVIDER_SITE_OTHER): Payer: 59 | Admitting: Physician Assistant

## 2018-07-18 VITALS — BP 110/68 | HR 102 | Temp 99.9°F | Ht 64.5 in | Wt 152.4 lb

## 2018-07-18 DIAGNOSIS — R82998 Other abnormal findings in urine: Secondary | ICD-10-CM

## 2018-07-18 DIAGNOSIS — R5383 Other fatigue: Secondary | ICD-10-CM | POA: Diagnosis not present

## 2018-07-18 DIAGNOSIS — J029 Acute pharyngitis, unspecified: Secondary | ICD-10-CM

## 2018-07-18 LAB — POCT RAPID STREP A (OFFICE): Rapid Strep A Screen: NEGATIVE

## 2018-07-18 LAB — POCT URINALYSIS DIPSTICK
Bilirubin, UA: NEGATIVE
Blood, UA: NEGATIVE
Glucose, UA: NEGATIVE
Ketones, UA: NEGATIVE
Nitrite, UA: NEGATIVE
Protein, UA: NEGATIVE
Spec Grav, UA: 1.01 (ref 1.010–1.025)
Urobilinogen, UA: 0.2 E.U./dL
pH, UA: 8 (ref 5.0–8.0)

## 2018-07-18 LAB — POCT URINE PREGNANCY: Preg Test, Ur: NEGATIVE

## 2018-07-18 MED ORDER — PREDNISONE 20 MG PO TABS: 20.0000 mg | ORAL_TABLET | Freq: Every day | ORAL | 0 refills | Status: DC

## 2018-07-18 MED ORDER — CEPHALEXIN 500 MG PO CAPS: 500.0000 mg | ORAL_CAPSULE | Freq: Two times a day (BID) | ORAL | 0 refills | Status: DC

## 2018-07-18 NOTE — Progress Notes (Signed)
Tonya Arnold is a 31 y.o. female here to Establish Care.  I acted as a Neurosurgeon for [employer], PA-C Corky Mull, LPN  History of Present Illness:   Chief Complaint  Patient presents with  . Establish Care  . Sore Throat    Bodyaches,fever,chills,headache since Friday    Acute Concerns: Fatigue and body aches --patient reports Friday she developed body aches, fever, chills, headache.  Her daughter at home has also been sick.  She works in HR.  She has been pushing fluids has had decreased appetite.  Denies diarrhea, vomiting.  The only thing that she has taken her symptoms is ibuprofen without significant relief.  She denies dysuria she states that her period is due any day now.  There is a chance that she could be pregnant.  Health Maintenance: Immunizations -- UTD Colonoscopy -- N/A Mammogram -- N/A PAP -- UTD, will obtain records Bone Density -- N/A Weight -- Weight: 152 lb 6.1 oz (69.1 kg)   Depression screen [facility] 2/9 07/18/2018  Decreased Interest 0  Down, Depressed, Hopeless 1  PHQ - 2 Score 1  Altered sleeping 2  Tired, decreased energy 2  Change in appetite 0  Feeling bad or failure about yourself  2  Trouble concentrating 0  Moving slowly or fidgety/restless 0  Suicidal thoughts 0  PHQ-9 Score 7  Difficult doing work/chores Not difficult at all    GAD 7 : Generalized Anxiety Score 07/18/2018 07/18/2018 06/13/2018  Nervous, Anxious, on Edge 1 1 0  Control/stop worrying 1 1 2   Worry too much - different things 1 1 2   Trouble relaxing 1 1 2   Restless 0 0 0  Easily annoyed or irritable 1 1 0  Afraid - awful might happen 0 0 1  Total GAD 7 Score 5 5 7   Anxiety Difficulty Somewhat difficult Somewhat difficult -     Other providers/specialists: Patient Care Team: Jarold Motto, [place] as PCP - General (Physician Assistant)   Past Medical History:  Diagnosis Date  . History of chicken pox   . History of fainting spells of unknown cause   .  Migraine      Social History   Socioeconomic History  . Marital status: Married    Spouse name: Not on file  . Number of children: Not on file  . Years of education: Not on file  . Highest education level: Not on file  Occupational History  . Not on file  Social Needs  . Financial resource strain: Not on file  . Food insecurity:    Worry: Not on file    Inability: Not on file  . Transportation needs:    Medical: Not on file    Non-medical: Not on file  Tobacco Use  . Smoking status: Never Smoker  . Smokeless tobacco: Never Used  Substance and Sexual Activity  . Alcohol use: Yes    Comment: Occ  . Drug use: No  . Sexual activity: Yes  Lifestyle  . Physical activity:    Days per week: Not on file    Minutes per session: Not on file  . Stress: Not on file  Relationships  . Social connections:    Talks on phone: Not on file    Gets together: Not on file    Attends religious service: Not on file    Active member of club or organization: Not on file    Attends meetings of clubs or organizations: Not on file    Relationship  status: Not on file  . Intimate partner violence:    Fear of current or ex partner: Not on file    Emotionally abused: Not on file    Physically abused: Not on file    Forced sexual activity: Not on file  Other Topics Concern  . Not on file  Social History Narrative   Married   Geophysical data processor    Past Surgical History:  Procedure Laterality Date  . CESAREAN SECTION    . CESAREAN SECTION  07/10/2012   Procedure: CESAREAN SECTION;  Surgeon: Mickel Baas, MD;  Location: [facility];  Service: Obstetrics;  Laterality: N/A;  Repeat cesarean section with delivery of baby girl at 1700.  Apgars 8/9.    Family History  Problem Relation Age of Onset  . Asthma Mother   . Diabetes Father   . Hypertension Father   . Kidney disease Father   . Heart disease Father     Allergies  Allergen Reactions  . Amoxicillin Rash    Pt states she is  not allergic to penicillin or any other antibiotic     Current Medications:   Current Outpatient Medications:  .  ibuprofen (ADVIL,MOTRIN) 800 MG tablet, Take 800 mg by mouth every 8 (eight) hours as needed., Disp: , Rfl:  .  cephALEXin (KEFLEX) 500 MG capsule, Take 1 capsule (500 mg total) by mouth 2 (two) times daily for 7 days., Disp: 14 capsule, Rfl: 0 .  predniSONE (DELTASONE) 20 MG tablet, Take 1 tablet (20 mg total) by mouth daily with breakfast., Disp: 5 tablet, Rfl: 0   Review of Systems:   ROS Negative unless otherwise specified per HPI.  Vitals:   Vitals:   07/18/18 1022  BP: 110/68  Pulse: (!) 102  Temp: 99.9 F (37.7 C)  TempSrc: Oral  SpO2: 99%  Weight: 152 lb 6.1 oz (69.1 kg)  Height: 5' 4.5" (1.638 m)      Body mass index is 25.75 kg/m.  Physical Exam:   Physical Exam  Constitutional: She appears well-developed. She is cooperative.  Non-toxic appearance. She does not have a sickly appearance. She does not appear ill. No distress.  HENT:  Head: Normocephalic and atraumatic.  Right Ear: Tympanic membrane, external ear and ear canal normal. Tympanic membrane is not erythematous, not retracted and not bulging.  Left Ear: Tympanic membrane, external ear and ear canal normal. Tympanic membrane is not erythematous, not retracted and not bulging.  Nose: Mucosal edema and rhinorrhea present. Right sinus exhibits no maxillary sinus tenderness and no frontal sinus tenderness. Left sinus exhibits no maxillary sinus tenderness and no frontal sinus tenderness.  Mouth/Throat: Uvula is midline and mucous membranes are normal. Posterior oropharyngeal erythema present. No posterior oropharyngeal edema. Tonsils are 1+ on the right. Tonsils are 1+ on the left.  Eyes: Conjunctivae and lids are normal.  Neck: Trachea normal.  Cardiovascular: Normal rate, regular rhythm, S1 normal, S2 normal and normal heart sounds.  Pulmonary/Chest: Effort normal and breath sounds normal. She  has no decreased breath sounds. She has no wheezes. She has no rhonchi. She has no rales.  Lymphadenopathy:    She has no cervical adenopathy.  Neurological: She is alert.  Skin: Skin is warm, dry and intact.  Psychiatric: She has a normal mood and affect. Her speech is normal and behavior is normal.  Nursing note and vitals reviewed.   Results for orders placed or performed in visit on 07/18/18  POCT rapid strep A  Result Value  Ref Range   Rapid Strep A Screen Negative Negative  POCT urine pregnancy  Result Value Ref Range   Preg Test, Ur Negative Negative  POCT urinalysis dipstick  Result Value Ref Range   Color, UA dark yellow    Clarity, UA clear    Glucose, UA Negative Negative   Bilirubin, UA Negative    Ketones, UA Negative    Spec Grav, UA 1.010 1.010 - 1.025   Blood, UA Negative    pH, UA 8.0 5.0 - 8.0   Protein, UA Negative Negative   Urobilinogen, UA 0.2 0.2 or 1.0 E.U./dL   Nitrite, UA Negative    Leukocytes, UA Small (1+) (A) Negative   Appearance     Odor      Assessment and Plan:   Jama was seen today for establish care and sore throat.  Diagnoses and all orders for this visit:  Sore throat No red flags on exam. Strep negative. Wil trial prednisone to see if this will help with her symptoms. Reviewed return precautions including worsening fever, SOB, worsening cough or other concerns. Push fluids and rest. I recommend that patient follow-up if symptoms worsen or persist despite treatment x 7-10 days, sooner if needed.  Fatigue, unspecified type; Leukocytes in urine Urine pregnancy negative. UA positive for leuks will send off for culture. Start keflex in the meantime -- she thinks that she may have taken a cephalosporin in the past but denies knowledge for sure. I counseled on PCN allergy and possible cross-reactivity. Reviewed warning signs of worsening URI. -     POCT urine pregnancy -     POCT urinalysis dipstick -     Urine Culture  Other  orders -     cephALEXin (KEFLEX) 500 MG capsule; Take 1 capsule (500 mg total) by mouth 2 (two) times daily for 7 days. -     predniSONE (DELTASONE) 20 MG tablet; Take 1 tablet (20 mg total) by mouth daily with breakfast.  . Reviewed expectations re: course of current medical issues. . Discussed self-management of symptoms. . Outlined signs and symptoms indicating need for more acute intervention. . Patient verbalized understanding and all questions were answered. . See orders for this visit as documented in the electronic medical record. . Patient received an After-Visit Summary.  CMA or LPN served as scribe during this visit. History, Physical, and Plan performed by medical provider. The above documentation has been reviewed and is accurate and complete.  Jarold MottoSamantha Lesli Issa, PA-C

## 2018-07-18 NOTE — Patient Instructions (Addendum)
It was great to see you!  Star the oral antibiotic and prednisone.  Push fluids and get plenty of rest. Please return if you are not improving as expected, or if you have high fevers (>101.5) or difficulty swallowing or worsening productive cough.  Call clinic with questions.  I hope you start feeling better soon!

## 2018-07-19 ENCOUNTER — Telehealth: Payer: Self-pay | Admitting: Physician Assistant

## 2018-07-19 LAB — URINE CULTURE
MICRO NUMBER:: 91386111
Result:: NO GROWTH
SPECIMEN QUALITY:: ADEQUATE

## 2018-07-19 NOTE — Telephone Encounter (Signed)
Left message on voicemail to call office.  

## 2018-07-19 NOTE — Telephone Encounter (Signed)
Please call patient and see how she is feeling.  Tonya Arnold

## 2018-07-20 NOTE — Telephone Encounter (Signed)
Left message on voicemail to call office.  

## 2018-07-22 ENCOUNTER — Encounter: Payer: Self-pay | Admitting: *Deleted

## 2018-07-22 ENCOUNTER — Ambulatory Visit: Payer: Self-pay | Admitting: *Deleted

## 2018-07-22 NOTE — Telephone Encounter (Signed)
See other message

## 2018-07-22 NOTE — Telephone Encounter (Signed)
Left message on voicemail to call office and that Tonya Arnold wants to see her today. Please call office to schedule an appt.

## 2018-07-22 NOTE — Telephone Encounter (Signed)
Pt called back, told her needs to be seen to be re-evaluate and [facility] Pt said she can not come in till after work. Told pt she will need to go to Urgent Care then due to us being closed. Pt verbalized understanding but is frustrated cause she has been on the phone off and on afternoon with us. Told pt sorry I did try to call you back but got voicemail and I did try you on Tuesday and Wed to see how you were feeling. Pt verbalized understanding and will go to Urgent Care.

## 2018-07-22 NOTE — Telephone Encounter (Signed)
Pt calling with complaints of feeling worse since office visit on Monday, 11/18 with Isidor HoltsSamantha Worley,PA. Pt states she was started on Prednisone and Cephalexin but feels like she has become worse. Pt states she is coughing non stop to the point that it is making her throw up. Pt states her nose is running a lot and she has some shortness of breath with walking around and feels like she is gasping for air at times. Pt is currently at work. Offered pt appt but pt wants to know if something could be called in since she was just seen in the office on Monday.Pt wants to know if additional medications could be called in for cough and to help with worsening symptoms. If possible pt would like for medications to be sent to [facility] on [address]. Pt can be contacted at [phone] and states she has to go to a meeting in a hour so if she does not answer it is ok to leave a message.  Reason for Disposition . [1] Recent medical visit within 24 hours AND [2] condition/symptoms worse  Answer Assessment - Initial Assessment Questions 1. MAIN CONCERN OR SYMPTOM:  "What is your main concern right now?" "What question do you have?" "What's the main symptom you're worried about?" (e.g., breathing difficulty, cough, fever. pain)     Cough and shortness of breath 2. ONSET: "When did the cough and shortness of breath  start?"     Pt starts that the symptoms have become worse since app on Monday 3. BETTER-SAME-WORSE: "Are you getting better, staying the same, or getting worse compared to how you felt at your last visit to the doctor (most recent medical visit)"?     worse 4. VISIT DATE: "When were you seen?" (Date)     07/18/18 5. VISIT DOCTOR: "What is the name of the doctor taking care of you now?"     Samantha Worley,PA 6. VISIT DIAGNOSIS:  "What was the main symptom or problem that you were seen for?" "Were you given a diagnosis?"       7. VISIT MEDICATIONS: "Did the physician order any new medicines for you to use?"  If yes: "Have you filled the prescription and started taking the medicine?"      Pt was prescribed prednisone and an  8. NEXT APPOINTMENT: "Have you scheduled a follow-up appointment with your doctor?"     No follow up appt scheduled 9. PAIN: "Is there any pain?" If so, ask: "How bad is it?"  (Scale 1-10; or mild, moderate, severe)     No complaints of pain at this time 10. FEVER: "Do you have a fever?" If so, ask: "What is it, how was it measured  and when did it start?"       Pt does not report a fever at this time 11. OTHER SYMPTOMS: "Do you have any other symptoms?"       No other symptoms voiced currently  Protocols used: RECENT MEDICAL VISIT FOR ILLNESS FOLLOW-UP CALL-A-AH

## 2018-07-22 NOTE — Telephone Encounter (Signed)
This encounter was created in error - please disregard.

## 2018-07-22 NOTE — Telephone Encounter (Signed)
Please call patient and see how she is doing. We have tried to call her multiple times this week to see how she is feeling.  I have appointments available to see her this afternoon. I do think that she needs to be seen with her worsening symptoms. We need to re-check her vitals and we need to make sure that she doesn't have pneumonia, as she may need a chest xray and may require a certain antibiotic for this. Please call patient and let her know this. If she is unable to make it into our office, she may also try urgent care.

## 2018-07-23 ENCOUNTER — Ambulatory Visit: Payer: 59 | Admitting: Family Medicine

## 2018-07-23 ENCOUNTER — Ambulatory Visit (INDEPENDENT_AMBULATORY_CARE_PROVIDER_SITE_OTHER): Payer: 59 | Admitting: Osteopathic Medicine

## 2018-07-23 ENCOUNTER — Encounter: Payer: Self-pay | Admitting: Osteopathic Medicine

## 2018-07-23 ENCOUNTER — Ambulatory Visit (INDEPENDENT_AMBULATORY_CARE_PROVIDER_SITE_OTHER): Payer: 59

## 2018-07-23 ENCOUNTER — Other Ambulatory Visit: Payer: Self-pay

## 2018-07-23 VITALS — BP 103/66 | HR 94 | Temp 98.8°F | Resp 18 | Ht 65.43 in | Wt 155.4 lb

## 2018-07-23 DIAGNOSIS — R05 Cough: Secondary | ICD-10-CM

## 2018-07-23 DIAGNOSIS — R059 Cough, unspecified: Secondary | ICD-10-CM

## 2018-07-23 MED ORDER — DOXYCYCLINE HYCLATE 100 MG PO TABS: 100.0000 mg | ORAL_TABLET | Freq: Two times a day (BID) | ORAL | 0 refills | Status: DC

## 2018-07-23 MED ORDER — GUAIFENESIN-CODEINE 100-10 MG/5ML PO SYRP: 5.0000 mL | ORAL_SOLUTION | Freq: Three times a day (TID) | ORAL | 0 refills | Status: DC | PRN

## 2018-07-23 MED ORDER — IPRATROPIUM BROMIDE 0.06 % NA SOLN: 2.0000 | Freq: Four times a day (QID) | NASAL | 0 refills | Status: DC

## 2018-07-23 NOTE — Progress Notes (Signed)
HPI: Tonya Arnold is a 32 y.o. female who  has a past medical history of History of chicken pox, History of fainting spells of unknown cause, and Migraine.  she presents to [facility] today, 07/23/18,  for chief complaint of: Cough, sinus congestion   Seen at [facility] 07/18/18 (5 days ago) for sore throat, strep and flu neg, tx prednisone and was given Keflex for possible UTI. Reports persistent sinus pain/pressure, cough, at this point for 8 days. No other home meds or remedies tried for cough.   Patient is accompanied by husband who assists with history-taking.       Past medical, surgical, social and family history reviewed:  Patient Active Problem List   Diagnosis Date Noted  . Bruising, spontaneous 08/26/2017  . RLS (restless legs syndrome) 08/26/2017    Past Surgical History:  Procedure Laterality Date  . CESAREAN SECTION    . CESAREAN SECTION  07/10/2012   Procedure: CESAREAN SECTION;  Surgeon: Mickel Baas, MD;  Location: [facility];  Service: Obstetrics;  Laterality: N/A;  Repeat cesarean section with delivery of baby girl at 1700.  Apgars 8/9.    Social History   Tobacco Use  . Smoking status: Never Smoker  . Smokeless tobacco: Never Used  Substance Use Topics  . Alcohol use: Yes    Comment: Occ    Family History  Problem Relation Age of Onset  . Asthma Mother   . Diabetes Father   . Hypertension Father   . Kidney disease Father   . Heart disease Father      Current medication list and allergy/intolerance information reviewed:    Current Outpatient Medications  Medication Sig Dispense Refill  . cephALEXin (KEFLEX) 500 MG capsule Take 1 capsule (500 mg total) by mouth 2 (two) times daily for 7 days. 14 capsule 0  . ibuprofen (ADVIL,MOTRIN) 800 MG tablet Take 800 mg by mouth every 8 (eight) hours as needed.    . predniSONE (DELTASONE) 20 MG tablet Take 1 tablet (20 mg total) by mouth daily with breakfast. (Patient not  taking: Reported on 07/23/2018) 5 tablet 0   No current facility-administered medications for this visit.     Allergies  Allergen Reactions  . Amoxicillin Rash    Pt states she is not allergic to penicillin or any other antibiotic      Review of Systems:  Constitutional:  No  fever, +chills, +recent illness, No unintentional weight changes. +significant fatigue.   HEENT: No  headache, no vision change, no hearing change, +sore throat, +sinus pressure  Cardiac: No  chest pain, No  pressure, No palpitations  Respiratory:  No  shortness of breath. +Cough  Gastrointestinal: No  abdominal pain, No  nausea, No  vomiting,  No  blood in stool, No  diarrhea  Musculoskeletal: No new myalgia/arthralgia  Skin: No  Rash  Neurologic: No  weakness, No  dizziness  Exam:  BP 103/66   Pulse 94   Temp 98.8 F (37.1 C) (Oral)   Resp 18   Ht 5' 5.43" (1.662 m)   Wt 155 lb 6.4 oz (70.5 kg)   LMP 06/27/2018 (Approximate)   SpO2 99%   BMI 25.52 kg/m   Constitutional: VS see above. General Appearance: alert, well-developed, well-nourished, NAD  Eyes: Normal lids and conjunctive, non-icteric sclera  Ears, Nose, Mouth, Throat: MMM, Normal external inspection ears/nares/mouth/lips/gums. TM normal bilaterally. Pharynx/tonsils no erythema, no exudate. Nasal mucosa normal.   Neck: No masses, trachea midline.  No tenderness/mass appreciated. No lymphadenopathy  Respiratory: Normal respiratory effort. no wheeze, no rhonchi, no rales  Cardiovascular: S1/S2 normal, no murmur, no rub/gallop auscultated. RRR. No lower extremity edema.   Gastrointestinal: Nontender, no masses. Bowel sounds normal.  Musculoskeletal: Gait normal.   Neurological: Normal balance/coordination. No tremor.   Skin: warm, dry, intact.   Psychiatric: Normal judgment/insight. Normal mood and affect.   Dg Chest 2 View  Result Date: 07/23/2018 CLINICAL DATA:  Persistent cough EXAM: CHEST - 2 VIEW COMPARISON:   06/13/2018 FINDINGS: Normal heart size, mediastinal contours, and pulmonary vascularity. Lungs clear. No acute infiltrate, pleural effusion or pneumothorax. Bones unremarkable. IMPRESSION: No acute abnormalities. Electronically Signed   By: Ulyses Southward M.D.   On: 07/23/2018 11:04     ASSESSMENT/PLAN:   Cough - Plan: DG Chest 2 View   Meds ordered this encounter  Medications  . ipratropium (ATROVENT) 0.06 % nasal spray    Sig: Place 2 sprays into both nostrils 4 (four) times daily.    Dispense:  15 mL    Refill:  0  . guaiFENesin-codeine (ROBITUSSIN AC) 100-10 MG/5ML syrup    Sig: Take 5-10 mLs by mouth 3 (three) times daily as needed for cough.    Dispense:  118 mL    Refill:  0  . DISCONTD: doxycycline (VIBRA-TABS) 100 MG tablet    Sig: Take 1 tablet (100 mg total) by mouth 2 (two) times daily.    Dispense:  10 tablet    Refill:  0  . doxycycline (VIBRA-TABS) 100 MG tablet    Sig: Take 1 tablet (100 mg total) by mouth 2 (two) times daily.    Dispense:  10 tablet    Refill:  0       Patient Instructions   I think cough may be due to viral bronchitis which will run its course in the next few days, or possible sinus infection which is causing postnasal drip to trigger a cough and mucus production.   Medications to pharmacy: Atrovent nasal spray to reduce sinus congestion and postnasal drip Codeine cough syrup to help cough, help sleep  Medications printed: Augmentin antibiotic for possible sinus infection - fill and take ONLY if no better after 10 days of symptoms (Monday or Tuesday)   See below for list of over-the-counter medications which may help.      Over-the-Counter Medications & Home Remedies for Upper Respiratory Illness  Aches/Pains, Fever, Headache Acetaminophen (Tylenol) 500 mg tablets - take max 2 tablets (1000 mg) every 6 hours (4 times per day)  Ibuprofen (Motrin) 200 mg tablets - take max 4 tablets (800 mg) every 6 hours*  Sinus  Congestion Prescription Atrovent Nasal Spray as directed Nasal Saline if desired to rinse Phenylephrine (Sudafed) 10 mg tablets every 4 hours (or the 12-hour formulation)* Diphenhydramine (Benadryl) 25 mg tablets - take max 2 tablets every 4 hours  Cough & Sore Throat Prescription cough pills or syrups as directed Dextromethorphan (Robitussin, others) - cough suppressant Lozenges w/ Benzocaine + Menthol (Cepacol) Honey - as much as you want! Teas which "coat the throat" - look for ingredients Elm Bark, Licorice Root, Marshmallow Root  Other Zinc Lozenges within 24 hours of symptoms onset - mixed evidence this shortens the duration of the common cold Don't waste your money on Vitamin C or Echinacea      Visit summary with medication list and pertinent instructions was printed for patient to review. All questions at time of visit were answered - patient instructed  to contact office with any additional concerns. ER/RTC precautions were reviewed with the patient.   Follow-up plan: Return if symptoms worsen or fail to improve.    Please note: voice recognition software was used to produce this document, and typos may escape review. Please contact Dr. Lyn HollingsheadAlexander for any needed clarifications.

## 2018-07-23 NOTE — Patient Instructions (Addendum)
I think cough may be due to viral bronchitis which will run its course in the next few days, or possible sinus infection which is causing postnasal drip to trigger a cough and mucus production.   Medications to pharmacy: Atrovent nasal spray to reduce sinus congestion and postnasal drip Codeine cough syrup to help cough, help sleep  Medications printed: Doxycycline antibiotic for possible sinus infection - fill and take ONLY if no better after 10 days of symptoms (Monday or Tuesday)   See below for list of over-the-counter medications which may help.      Over-the-Counter Medications & Home Remedies for Upper Respiratory Illness  Aches/Pains, Fever, Headache Acetaminophen (Tylenol) 500 mg tablets - take max 2 tablets (1000 mg) every 6 hours (4 times per day)  Ibuprofen (Motrin) 200 mg tablets - take max 4 tablets (800 mg) every 6 hours*  Sinus Congestion Prescription Atrovent Nasal Spray as directed Nasal Saline if desired to rinse Phenylephrine (Sudafed) 10 mg tablets every 4 hours (or the 12-hour formulation)* Diphenhydramine (Benadryl) 25 mg tablets - take max 2 tablets every 4 hours  Cough & Sore Throat Prescription cough pills or syrups as directed Dextromethorphan (Robitussin, others) - cough suppressant Lozenges w/ Benzocaine + Menthol (Cepacol) Honey - as much as you want! Teas which "coat the throat" - look for ingredients Elm Bark, Licorice Root, Marshmallow Root  Other Zinc Lozenges within 24 hours of symptoms onset - mixed evidence this shortens the duration of the common cold Don't waste your money on Vitamin C or Echinacea        If you have lab work done today you will be contacted with your lab results within the next 2 weeks.  If you have not heard from us then please contact us. The fastest way to get your results is to register for My Chart.   IF you received an x-ray today, you will receive an invoice from [facility]. Please contact  [facility] at [phone] with questions or concerns regarding your invoice.   IF you received labwork today, you will receive an invoice from [facility]. Please contact [facility] at [phone] with questions or concerns regarding your invoice.   Our billing staff will not be able to assist you with questions regarding bills from these companies.  You will be contacted with the lab results as soon as they are available. The fastest way to get your results is to activate your My Chart account. Instructions are located on the last page of this paperwork. If you have not heard from us regarding the results in 2 weeks, please contact this office.

## 2018-07-25 ENCOUNTER — Telehealth: Payer: Self-pay | Admitting: Physician Assistant

## 2018-07-25 NOTE — Telephone Encounter (Signed)
Copied from CRM [id number]. Topic: Quick Communication - See Telephone Encounter >> Jul 25, 2018  3:14 PM Jilda Rocheemaray, Melissa wrote: CRM for notification. See Telephone encounter for: 07/25/18.  Pt husband called in and stated that he needs a statement saying that patient was not able to fly he is trying to get his money back for the the flight and they are requesting this for the refund.   Pt would like to pick up at that office by Wednesday.

## 2018-07-26 ENCOUNTER — Encounter: Payer: Self-pay | Admitting: Physician Assistant

## 2018-07-26 ENCOUNTER — Encounter: Payer: Self-pay | Admitting: *Deleted

## 2018-07-26 NOTE — Telephone Encounter (Signed)
Left detailed message.  We can give a note that the patient was seen that day and what she was seen for, but cannot give a unable to fly due to a cough

## 2018-08-01 ENCOUNTER — Encounter: Payer: Self-pay | Admitting: *Deleted

## 2018-08-01 NOTE — Telephone Encounter (Signed)
See note

## 2018-08-01 NOTE — Telephone Encounter (Signed)
Left detailed message on Tonya Arnold's personal voicemail. Letter for Tonya Arnold is ready for pickup will be at the front desk. Any questions please call office.

## 2018-08-01 NOTE — Telephone Encounter (Signed)
Pt husband called in and stated that he is needing a statement saying that pt was sick and pt could not fly on Friday the 23rd.  He [facility] She was not any better by the 20th and tried to get back in to [facility].  He stated that his wife had a demanding job and could only be seen at certain times.  He then made an appt with the Saturday clinic at [facility] with Dr fry.  he called back and cancelled be he wanted his wife to have a xray and the Saturday clinic did not have xrays.  So he then called [facility] and sch an appt to be seen at [facility] on Sat the 23rd.  He has called them as well requesting some type of statement to say that pt needed rest and she could not fly. [facility] stated they could only provide a statement that pt was seen on the office on 11/23.  So now he is calling  [facility] and would like to know if she could provide a statement that pt need to rest and was not better and could not fly.  Please advise      Best number - [phone] Husband Onalee Huaavid

## 2018-08-01 NOTE — Telephone Encounter (Signed)
Patients husband called in regards to not hearing anything about the note he asked for in regards to his wife not being able to fly and being able to get his money back for his flight for her  He states that she was written a note to not go to work so why can a note not be written for the flight. He said she was contagious. Please advise and call him at [phone].

## 2018-08-01 NOTE — Telephone Encounter (Signed)
Spoke with husband.  Patient flight was on 11/22 patient wasn't seen here until 11/23 . Patient was seen at another practice on 11/18 were a note was given to her to be out of work.  Patients husband was advised he would need to see if [place].   He was advised we can give a note what he was seen here for but not that she couldn't fly.

## 2018-08-06 ENCOUNTER — Ambulatory Visit (INDEPENDENT_AMBULATORY_CARE_PROVIDER_SITE_OTHER): Payer: 59 | Admitting: Family Medicine

## 2018-08-06 ENCOUNTER — Encounter: Payer: Self-pay | Admitting: Family Medicine

## 2018-08-06 VITALS — BP 122/74 | HR 83 | Temp 98.8°F | Ht 65.0 in | Wt 158.0 lb

## 2018-08-06 DIAGNOSIS — J029 Acute pharyngitis, unspecified: Secondary | ICD-10-CM | POA: Diagnosis not present

## 2018-08-06 LAB — POCT RAPID STREP A (OFFICE): Rapid Strep A Screen: NEGATIVE

## 2018-08-06 MED ORDER — CEFDINIR 300 MG PO CAPS: 300.0000 mg | ORAL_CAPSULE | Freq: Two times a day (BID) | ORAL | 0 refills | Status: AC

## 2018-08-06 NOTE — Progress Notes (Signed)
Tonya Arnold - 32 y.o. female MRN [id number]  Date of birth: 1986/08/23  Subjective Chief Complaint  Patient presents with  . Cough    Ongoing for one month-completed course of keflex and doxyclycine. Sore throat-admits difficutly swallowing.     HPI Tonya Arnold is a 32 y.o. female with here today for ongoing symptoms of pharyngitis with cough and congestion.  Initial symptoms started about 1 month ago.  She has completed courses of cephalexin and doxycycline.  She did have some improvement with doxycycline however symptoms returned after completion of antibiotic.  She denies fever, chills, shortness of breath, wheezing.  She has had some intermittent sinus pain.  She is drinking a good amount of fluids and using a humidifier at home.    ROS:  A comprehensive ROS was completed and negative except as noted per HPI  Allergies  Allergen Reactions  . Amoxicillin Rash    Pt states she is not allergic to penicillin or any other antibiotic    Past Medical History:  Diagnosis Date  . History of chicken pox   . History of fainting spells of unknown cause   . Migraine     Past Surgical History:  Procedure Laterality Date  . CESAREAN SECTION    . CESAREAN SECTION  07/10/2012   Procedure: CESAREAN SECTION;  Surgeon: Mickel Baas, MD;  Location: [facility];  Service: Obstetrics;  Laterality: N/A;  Repeat cesarean section with delivery of baby girl at 1700.  Apgars 8/9.    Social History   Socioeconomic History  . Marital status: Married    Spouse name: Not on file  . Number of children: 2  . Years of education: Not on file  . Highest education level: Not on file  Occupational History  . Not on file  Social Needs  . Financial resource strain: Not on file  . Food insecurity:    Worry: Not on file    Inability: Not on file  . Transportation needs:    Medical: Not on file    Non-medical: Not on file  Tobacco Use  . Smoking status: Never Smoker  .  Smokeless tobacco: Never Used  Substance and Sexual Activity  . Alcohol use: Yes    Comment: Occ  . Drug use: No  . Sexual activity: Yes  Lifestyle  . Physical activity:    Days per week: Not on file    Minutes per session: Not on file  . Stress: Not on file  Relationships  . Social connections:    Talks on phone: Not on file    Gets together: Not on file    Attends religious service: Not on file    Active member of club or organization: Not on file    Attends meetings of clubs or organizations: Not on file    Relationship status: Not on file  Other Topics Concern  . Not on file  Social History Narrative   Married   Geophysical data processor    Family History  Problem Relation Age of Onset  . Asthma Mother   . Diabetes Father   . Hypertension Father   . Kidney disease Father   . Heart disease Father     Health Maintenance  Topic Date Due  . INFLUENZA VACCINE  11/29/2018 (Originally 03/31/2018)  . PAP SMEAR  07/19/2019 (Originally 12/05/2018)  . TETANUS/TDAP  04/18/2022  . HIV Screening  Completed    ----------------------------------------------------------------------------------------------------------------------------------------------------------------------------------------------------------------- Physical Exam BP 122/74   Pulse 83  Temp 98.8 F (37.1 C) (Oral)   Ht 5\' 5"  (1.651 m)   Wt 158 lb (71.7 kg)   SpO2 98%   BMI 26.29 kg/m   Physical Exam  Constitutional: She is oriented to person, place, and time. She appears well-nourished. No distress.  HENT:  Head: Normocephalic and atraumatic.  Mouth/Throat: Oropharynx is clear and moist. No oropharyngeal exudate.  Eyes: No scleral icterus.  Cardiovascular: Normal rate, regular rhythm and normal heart sounds.  Pulmonary/Chest: Effort normal and breath sounds normal.  Lymphadenopathy:    She has cervical adenopathy.  Neurological: She is alert and oriented to person, place, and time.  Skin: Skin is warm  and dry.  Psychiatric: She has a normal mood and affect. Her behavior is normal.    ------------------------------------------------------------------------------------------------------------------------------------------------------------------------------------------------------------------- Assessment and Plan  Pharyngitis -POC strep test negative -Will try rx for cefidinir given prolonged symptoms and some improvement with doxycycline previously -Continue to push fluids -Mono included in differential although hasn't really had fever or fatigue that would typically be associated with this.

## 2018-08-06 NOTE — Patient Instructions (Signed)

## 2018-08-06 NOTE — Assessment & Plan Note (Addendum)
-  POC strep test negative -Will try rx for cefidinir given prolonged symptoms and some improvement with doxycycline previously -Continue to push fluids -Mono included in differential although hasn't really had fever or fatigue that would typically be associated with this.

## 2018-08-10 DIAGNOSIS — Z0279 Encounter for issue of other medical certificate: Secondary | ICD-10-CM

## 2018-10-13 ENCOUNTER — Encounter: Payer: Self-pay | Admitting: Family Medicine

## 2018-10-13 ENCOUNTER — Telehealth: Payer: Self-pay | Admitting: Physician Assistant

## 2018-10-13 ENCOUNTER — Ambulatory Visit (INDEPENDENT_AMBULATORY_CARE_PROVIDER_SITE_OTHER): Payer: BLUE CROSS/BLUE SHIELD | Admitting: Family Medicine

## 2018-10-13 ENCOUNTER — Other Ambulatory Visit: Payer: Self-pay

## 2018-10-13 VITALS — BP 118/68 | HR 75 | Temp 99.1°F | Ht 65.0 in | Wt 152.0 lb

## 2018-10-13 DIAGNOSIS — Z30011 Encounter for initial prescription of contraceptive pills: Secondary | ICD-10-CM | POA: Diagnosis not present

## 2018-10-13 DIAGNOSIS — R6889 Other general symptoms and signs: Secondary | ICD-10-CM

## 2018-10-13 DIAGNOSIS — R52 Pain, unspecified: Secondary | ICD-10-CM

## 2018-10-13 DIAGNOSIS — R6883 Chills (without fever): Secondary | ICD-10-CM

## 2018-10-13 DIAGNOSIS — Z09 Encounter for follow-up examination after completed treatment for conditions other than malignant neoplasm: Secondary | ICD-10-CM

## 2018-10-13 LAB — POCT INFLUENZA A/B
Influenza A, POC: NEGATIVE
Influenza B, POC: NEGATIVE

## 2018-10-13 MED ORDER — NORETHIN-ETH ESTRAD-FE BIPHAS 1 MG-10 MCG / 10 MCG PO TABS: 1.0000 | ORAL_TABLET | Freq: Every day | ORAL | 11 refills | Status: DC

## 2018-10-13 NOTE — Progress Notes (Signed)
Established Patient Office Visit  Subjective:  Patient ID: Tonya Arnold, female    DOB: Aug 15, 1986  Age: 33 y.o. MRN: [id number]  CC:  Chief Complaint  Patient presents with  . Generalized Body Aches    started tue night   . Chills  . Headache  . Sore Throat  . Contraception    HPI Tonya Arnold is a 33 year old female who presents for Sick Visit today.   Past Medical History:  Diagnosis Date  . History of chicken pox   . History of fainting spells of unknown cause   . Migraine    Current Status: Since her last office visit, she is doing well with c/o body aches, fatigue, headaches, chills, and sore throat X 2 days now. She has been drinking plenty of fluids since symptoms began. She states that every member in her household has been diagnosed with Influenza lately.   She denies fevers, recent infections, weight loss, and night sweats. She has not had any visual changes, dizziness, and falls. No chest pain, heart palpitations, cough and shortness of breath reported. No reports of GI problems such as nausea, vomiting, diarrhea, and constipation. She has no reports of blood in stools, dysuria and hematuria. No depression or anxiety reported. She denies pain today.   Past Surgical History:  Procedure Laterality Date  . CESAREAN SECTION    . CESAREAN SECTION  07/10/2012   Procedure: CESAREAN SECTION;  Surgeon: Mickel Baasichard D Kaplan, MD;  Location: [facility];  Service: Obstetrics;  Laterality: N/A;  Repeat cesarean section with delivery of baby girl at 1700.  Apgars 8/9.    Family History  Problem Relation Age of Onset  . Asthma Mother   . Diabetes Father   . Hypertension Father   . Kidney disease Father   . Heart disease Father     Social History   Socioeconomic History  . Marital status: Married    Spouse name: Not on file  . Number of children: 2  . Years of education: Not on file  . Highest education level: Not on file  Occupational History  .  Not on file  Social Needs  . Financial resource strain: Not on file  . Food insecurity:    Worry: Not on file    Inability: Not on file  . Transportation needs:    Medical: Not on file    Non-medical: Not on file  Tobacco Use  . Smoking status: Never Smoker  . Smokeless tobacco: Never Used  Substance and Sexual Activity  . Alcohol use: Yes    Comment: Occ  . Drug use: No  . Sexual activity: Yes  Lifestyle  . Physical activity:    Days per week: Not on file    Minutes per session: Not on file  . Stress: Not on file  Relationships  . Social connections:    Talks on phone: Not on file    Gets together: Not on file    Attends religious service: Not on file    Active member of club or organization: Not on file    Attends meetings of clubs or organizations: Not on file    Relationship status: Not on file  . Intimate partner violence:    Fear of current or ex partner: Not on file    Emotionally abused: Not on file    Physically abused: Not on file    Forced sexual activity: Not on file  Other Topics Concern  . Not  on file  Social History Narrative   Married   Geophysical data processorHuman Resources Manager    Outpatient Medications Prior to Visit  Medication Sig Dispense Refill  . guaiFENesin-codeine (ROBITUSSIN AC) 100-10 MG/5ML syrup Take 5-10 mLs by mouth 3 (three) times daily as needed for cough. 118 mL 0  . ibuprofen (ADVIL,MOTRIN) 800 MG tablet Take 800 mg by mouth every 8 (eight) hours as needed.    Marland Kitchen. ipratropium (ATROVENT) 0.06 % nasal spray Place 2 sprays into both nostrils 4 (four) times daily. 15 mL 0   No facility-administered medications prior to visit.     Allergies  Allergen Reactions  . Amoxicillin Rash    Pt states she is not allergic to penicillin or any other antibiotic    ROS Review of Systems  Constitutional: Positive for chills and fatigue.  HENT: Negative.   Eyes: Negative.   Respiratory: Negative.   Cardiovascular: Negative.   Gastrointestinal: Negative.     Endocrine: Negative.   Genitourinary: Negative.   Musculoskeletal: Negative.        Muscle Aches  Skin: Negative.   Allergic/Immunologic: Negative.   Neurological: Negative.   Hematological: Negative.   Psychiatric/Behavioral: Negative.    Objective:    Physical Exam  Constitutional: She is oriented to person, place, and time. She appears well-developed and well-nourished.  HENT:  Head: Normocephalic and atraumatic.  Eyes: Conjunctivae are normal.  Neck: Normal range of motion. Neck supple.  Cardiovascular: Normal rate, regular rhythm, normal heart sounds and intact distal pulses.  Pulmonary/Chest: Effort normal and breath sounds normal.  Abdominal: Soft. Bowel sounds are normal.  Musculoskeletal: Normal range of motion.  Neurological: She is alert and oriented to person, place, and time. She has normal reflexes.  Skin: Skin is warm and dry.  Psychiatric: She has a normal mood and affect. Her behavior is normal. Judgment and thought content normal.  Nursing note and vitals reviewed.   BP 118/68 (BP Location: Right Arm, Patient Position: Sitting, Cuff Size: Normal)   Pulse 75   Temp 99.1 F (37.3 C) (Oral)   Ht 5\' 5"  (1.651 m)   Wt 152 lb (68.9 kg)   SpO2 100%   BMI 25.29 kg/m  Wt Readings from Last 3 Encounters:  10/13/18 152 lb (68.9 kg)  08/06/18 158 lb (71.7 kg)  07/23/18 155 lb 6.4 oz (70.5 kg)     There are no preventive care reminders to display for this patient.  There are no preventive care reminders to display for this patient.  Lab Results  Component Value Date   TSH 2.120 06/13/2018   Lab Results  Component Value Date   WBC 6.7 06/13/2018   HGB 13.1 06/13/2018   HCT 39.0 06/13/2018   MCV 86 06/13/2018   PLT 228 06/13/2018   Lab Results  Component Value Date   NA 143 06/13/2018   K 4.2 06/13/2018   CO2 22 06/13/2018   GLUCOSE 83 06/13/2018   BUN 10 06/13/2018   CREATININE 0.81 06/13/2018   BILITOT 0.8 06/13/2018   ALKPHOS 59 06/13/2018    AST 21 06/13/2018   ALT 19 06/13/2018   PROT 6.8 06/13/2018   ALBUMIN 4.5 06/13/2018   CALCIUM 9.3 06/13/2018   Lab Results  Component Value Date   CHOL 190 06/13/2018   Lab Results  Component Value Date   HDL 59 06/13/2018   Lab Results  Component Value Date   LDLCALC 102 (H) 06/13/2018   Lab Results  Component Value Date   TRIG  145 06/13/2018   Lab Results  Component Value Date   CHOLHDL 3.2 06/13/2018   No results found for: HGBA1C  Assessment & Plan:   1. Flu-like symptoms  2. Chills (without fever) - POCT Influenza A/B  3. Generalized body aches Negative. - POCT Influenza A/B  4. Encounter for initial prescription of contraceptive pills We will initiate oral birth control today. - Norethindrone-Ethinyl Estradiol-Fe Biphas (LO LOESTRIN FE) 1 MG-10 MCG / 10 MCG tablet; Take 1 tablet by mouth daily.  Dispense: 1 Package; Refill: 11  5. Follow up She will follow up as needed.  Problem List Items Addressed This Visit    None    Visit Diagnoses    Chills (without fever)    -  Primary   Relevant Orders   POCT Influenza A/B   Generalized body aches       Relevant Orders   POCT Influenza A/B      No orders of the defined types were placed in this encounter.   Follow-up: No follow-ups on file.    Kallie Locks, FNP

## 2018-10-13 NOTE — Telephone Encounter (Signed)
Copied from CRM [id number]. Topic: Quick Communication - Rx Refill/Question >> Oct 13, 2018  6:07 PM Jilda Roche wrote: Medication: Norethindrone-Ethinyl Estradiol-Fe Biphas (LO LOESTRIN FE) 1 MG-10 MCG / 10 MCG tablet   Has the patient contacted their pharmacy? Yes.   (Agent: If no, request that the patient contact the pharmacy for the refill.) (Agent: If yes, when and what did the pharmacy advise?) Needs prior auth the phone number is [phone] or [phone]  Preferred Pharmacy (with phone number or street name): [id number] - [place], [place] - [address] AT [address] [facility] [address] [phone] (Phone) [phone] (Fax)    Agent: Please be advised that RX refills may take up to 3 business days. We ask that you follow-up with your pharmacy.

## 2018-10-13 NOTE — Telephone Encounter (Signed)
Tonya Arnold sent to pharmacy on 10/13/18 needs prior auth. The phone number is [phone] or [phone]

## 2018-10-13 NOTE — Patient Instructions (Addendum)
If you have lab work done today you will be contacted with your lab results within the next 2 weeks.  If you have not heard from us then please contact us. The fastest way to get your results is to register for My Chart.   IF you received an x-ray today, you will receive an invoice from [facility]. Please contact [facility] at [phone] with questions or concerns regarding your invoice.   IF you received labwork today, you will receive an invoice from [facility]. Please contact [facility] at [phone] with questions or concerns regarding your invoice.   Our billing staff will not be able to assist you with questions regarding bills from these companies.  You will be contacted with the lab results as soon as they are available. The fastest way to get your results is to activate your My Chart account. Instructions are located on the last page of this paperwork. If you have not heard from us regarding the results in 2 weeks, please contact this office.      Contraception Choices Contraception, also called birth control, means things to use or ways to try not to get pregnant. Hormonal birth control This kind of birth control uses hormones. Here are some types of hormonal birth control:  A tube that is put under skin of the arm (implant). The tube can stay in for as long as 3 years.  Shots to get every 3 months (injections).  Pills to take every day (birth control pills).  A patch to change 1 time each week for 3 weeks (birth control patch). After that, the patch is taken off for 1 week.  A ring to put in the vagina. The ring is left in for 3 weeks. Then it is taken out of the vagina for 1 week. Then a new ring is put in.  Pills to take after unprotected sex (emergency birth control pills). Barrier birth control Here are some types of barrier birth control:  A thin covering that is put on the penis before sex (female condom). The covering is thrown away after  sex.  A soft, loose covering that is put in the vagina before sex (female condom). The covering is thrown away after sex.  A rubber bowl that sits over the cervix (diaphragm). The bowl must be made for you. The bowl is put into the vagina before sex. The bowl is left in for 6-8 hours after sex. It is taken out within 24 hours.  A small, soft cup that fits over the cervix (cervical cap). The cup must be made for you. The cup can be left in for 6-8 hours after sex. It is taken out within 48 hours.  A sponge that is put into the vagina before sex. It must be left in for at least 6 hours after sex. It must be taken out within 30 hours. Then it is thrown away.  A chemical that kills or stops sperm from getting into the uterus (spermicide). It may be a pill, cream, jelly, or foam to put in the vagina. The chemical should be used at least 10-15 minutes before sex. IUD (intrauterine) birth control An IUD is a small, T-shaped piece of plastic. It is put inside the uterus. There are two kinds:  Hormone IUD. This kind can stay in for 3-5 years.  Copper IUD. This kind can stay in for 10 years. Permanent birth control Here are some types of permanent birth control:  Surgery to block  the fallopian tubes.  Having an insert put into each fallopian tube.  Surgery to tie off the tubes that carry sperm (vasectomy). Natural planning birth control Here are some types of natural planning birth control:  Not having sex on the days the woman could get pregnant.  Using a calendar: ? To keep track of the length of each period. ? To find out what days pregnancy can happen. ? To plan to not have sex on days when pregnancy can happen.  Watching for symptoms of ovulation and not having sex during ovulation. One way the woman can check for ovulation is to check her temperature.  Waiting to have sex until after ovulation. Summary  Contraception, also called birth control, means things to use or ways to try  not to get pregnant.  Hormonal methods of birth control include implants, injections, pills, patches, vaginal rings, and emergency birth control pills.  Barrier methods of birth control can include female condoms, female condoms, diaphragms, cervical caps, sponges, and spermicides.  There are two types of IUD (intrauterine device) birth control. An IUD can be put in a woman's uterus to prevent pregnancy for 3-5 years.  Permanent sterilization can be done through a procedure for males, females, or both.  Natural planning methods involve not having sex on the days when the woman could get pregnant. This information is not intended to replace advice given to you by your health care provider. Make sure you discuss any questions you have with your health care provider. Document Released: 06/14/2009 Document Revised: 03/24/2018 Document Reviewed: 08/27/2016 Elsevier Interactive Patient Education  2019 Elsevier Inc.  Rehydration, Adult Rehydration is the replacement of body fluids and salts and minerals (electrolytes) that are lost during dehydration. Dehydration is when there is not enough fluid or water in the body. This happens when you lose more fluids than you take in. Common causes of dehydration include:  Vomiting.  Diarrhea.  Excessive sweating, such as from heat exposure or exercise.  Taking medicines that cause the body to lose excess fluid (diuretics).  Impaired kidney function.  Not drinking enough fluid.  Certain illnesses or infections.  Certain poorly controlled long-term (chronic) illnesses, such as diabetes, heart disease, and kidney disease.  Symptoms of mild dehydration may include thirst, dry lips and mouth, dry skin, and dizziness. Symptoms of severe dehydration may include increased heart rate, confusion, fainting, and not urinating. You can rehydrate by drinking certain fluids or getting fluids through an IV tube, as told by your health care provider. What are the  risks? Generally, rehydration is safe. However, one problem that can happen is taking in too much fluid (overhydration). This is rare. If overhydration happens, it can cause an electrolyte imbalance, kidney failure, or a decrease in salt (sodium) levels in the body. How to rehydrate Follow instructions from your health care provider for rehydration. The kind of fluid you should drink and the amount you should drink depend on your condition.  If directed by your health care provider, drink an oral rehydration solution (ORS). This is a drink designed to treat dehydration that is found in pharmacies and retail stores. ? Make an ORS by following instructions on the package. ? Start by drinking small amounts, about  cup (120 mL) every 5-10 minutes. ? Slowly increase how much you drink until you have taken the amount recommended by your health care provider.  Drink enough clear fluids to keep your urine clear or pale yellow. If you were instructed to drink an ORS,  finish the ORS first, then start slowly drinking other clear fluids. Drink fluids such as: ? Water. Do not drink only water. Doing that can lead to having too little sodium in your body (hyponatremia). ? Ice chips. ? Fruit juice that you have added water to (diluted juice). ? Low-calorie sports drinks.  If you are severely dehydrated, your health care provider may recommend that you receive fluids through an IV tube in the hospital.  Do not take sodium tablets. Doing that can lead to the condition of having too much sodium in your body (hypernatremia). Eating while you rehydrate Follow instructions from your health care provider about what to eat while you rehydrate. Your health care provider may recommend that you slowly begin eating regular foods in small amounts.  Eat foods that contain a healthy balance of electrolytes, such as bananas, oranges, potatoes, tomatoes, and spinach.  Avoid foods that are greasy or contain a lot of fat or  sugar.  In some cases, you may get nutrition through a feeding tube that is passed through your nose and into your stomach (nasogastric tube, or NG tube). This may be done if you have uncontrolled vomiting or diarrhea. Beverages to avoid Certain beverages may make dehydration worse. While you rehydrate, avoid:  Alcohol.  Caffeine.  Drinks that contain a lot of sugar. These include: ? High-calorie sports drinks. ? Fruit juice that is not diluted. ? Soda.  Check nutrition labels to see how much sugar or caffeine a beverage contains. Signs of dehydration recovery You may be recovering from dehydration if:  You are urinating more often than before you started rehydrating.  Your urine is clear or pale yellow.  Your energy level improves.  You vomit less frequently.  You have diarrhea less frequently.  Your appetite improves or returns to normal.  You feel less dizzy or less light-headed.  Your skin tone and color start to look more normal. Contact a health care provider if:  You continue to have symptoms of mild dehydration, such as: ? Thirst. ? Dry lips. ? Slightly dry mouth. ? Dry, warm skin. ? Dizziness.  You continue to vomit or have diarrhea. Get help right away if:  You have symptoms of dehydration that get worse.  You feel: ? Confused. ? Weak. ? Like you are going to faint.  You have not urinated in 6-8 hours.  You have very dark urine.  You have trouble breathing.  Your heart rate while sitting still is over 100 beats a minute.  You cannot drink fluids without vomiting.  You have vomiting or diarrhea that: ? Gets worse. ? Does not go away.  You have a fever. This information is not intended to replace advice given to you by your health care provider. Make sure you discuss any questions you have with your health care provider. Document Released: 11/09/2011 Document Revised: 03/06/2016 Document Reviewed: 10/11/2015 Elsevier Interactive Patient  Education  2019 Elsevier Inc. Ibuprofen tablets and capsules What is this medicine? IBUPROFEN (eye BYOO proe fen) is a non-steroidal anti-inflammatory drug (NSAID). It is used for dental pain, fever, headaches or migraines, osteoarthritis, rheumatoid arthritis, or painful monthly periods. It can also relieve minor aches and pains caused by a cold, flu, or sore throat. This medicine may be used for other purposes; ask your health care provider or pharmacist if you have questions. COMMON BRAND NAME(S): Advil, Advil Junior Strength, Advil Migraine, Genpril, Ibren, IBU, Midol, Midol Cramps and Body Aches, Motrin, Motrin IB, Motrin Junior Strength, Motrin  Migraine Pain, Samson-8, Toxicology Saliva Collection What should I tell my health care provider before I take this medicine? They need to know if you have any of these conditions: -cigarette smoker -coronary artery bypass graft (CABG) surgery within the past 2 weeks -drink more than 3 alcohol-containing drinks a day -heart disease -high blood pressure -history of stomach bleeding -kidney disease -liver disease -lung or breathing disease, like asthma -an unusual or allergic reaction to ibuprofen, aspirin, other NSAIDs, other medicines, foods, dyes, or preservatives -pregnant or trying to get pregnant -breast-feeding How should I use this medicine? Take this medicine by mouth with a glass of water. Follow the directions on the prescription label. Take this medicine with food if your stomach gets upset. Try to not lie down for at least 10 minutes after you take the medicine. Take your medicine at regular intervals. Do not take your medicine more often than directed. A special MedGuide will be given to you by the pharmacist with each prescription and refill. Be sure to read this information carefully each time. Talk to your pediatrician regarding the use of this medicine in children. Special care may be needed. Overdosage: If you think you have  taken too much of this medicine contact a poison control center or emergency room at once. NOTE: This medicine is only for you. Do not share this medicine with others. What if I miss a dose? If you miss a dose, take it as soon as you can. If it is almost time for your next dose, take only that dose. Do not take double or extra doses. What may interact with this medicine? Do not take this medicine with any of the following medications: -cidofovir -ketorolac -methotrexate -pemetrexed This medicine may also interact with the following medications: -alcohol -aspirin -diuretics -lithium -other drugs for inflammation like prednisone -warfarin This list may not describe all possible interactions. Give your health care provider a list of all the medicines, herbs, non-prescription drugs, or dietary supplements you use. Also tell them if you smoke, drink alcohol, or use illegal drugs. Some items may interact with your medicine. What should I watch for while using this medicine? Tell your doctor or healthcare professional if your symptoms do not start to get better or if they get worse. This medicine does not prevent heart attack or stroke. In fact, this medicine may increase the chance of a heart attack or stroke. The chance may increase with longer use of this medicine and in people who have heart disease. If you take aspirin to prevent heart attack or stroke, talk with your doctor or health care professional. Do not take other medicines that contain aspirin, ibuprofen, or naproxen with this medicine. Side effects such as stomach upset, nausea, or ulcers may be more likely to occur. Many medicines available without a prescription should not be taken with this medicine. This medicine can cause ulcers and bleeding in the stomach and intestines at any time during treatment. Ulcers and bleeding can happen without warning symptoms and can cause death. To reduce your risk, do not smoke cigarettes or drink  alcohol while you are taking this medicine. You may get drowsy or dizzy. Do not drive, use machinery, or do anything that needs mental alertness until you know how this medicine affects you. Do not stand or sit up quickly, especially if you are an older patient. This reduces the risk of dizzy or fainting spells. This medicine can cause you to bleed more easily. Try to avoid damage to your  teeth and gums when you brush or floss your teeth. This medicine may be used to treat migraines. If you take migraine medicines for 10 or more days a month, your migraines may get worse. Keep a diary of headache days and medicine use. Contact your healthcare professional if your migraine attacks occur more frequently. What side effects may I notice from receiving this medicine? Side effects that you should report to your doctor or health care professional as soon as possible: -allergic reactions like skin rash, itching or hives, swelling of the face, lips, or tongue -severe stomach pain -signs and symptoms of bleeding such as bloody or black, tarry stools; red or dark-brown urine; spitting up blood or brown material that looks like coffee grounds; red spots on the skin; unusual bruising or bleeding from the eye, gums, or nose -signs and symptoms of a blood clot such as changes in vision; chest pain; severe, sudden headache; trouble speaking; sudden numbness or weakness of the face, arm, or leg -unexplained weight gain or swelling -unusually weak or tired -yellowing of eyes or skin Side effects that usually do not require medical attention (report to your doctor or health care professional if they continue or are bothersome): -bruising -diarrhea -dizziness, drowsiness -headache -nausea, vomiting This list may not describe all possible side effects. Call your doctor for medical advice about side effects. You may report side effects to FDA at 1-800-FDA-1088. Where should I keep my medicine? Keep out of the reach  of children. Store at room temperature between 15 and 30 degrees C (59 and 86 degrees F). Keep container tightly closed. Throw away any unused medicine after the expiration date. NOTE: This sheet is a summary. It may not cover all possible information. If you have questions about this medicine, talk to your doctor, pharmacist, or health care provider.  2019 Elsevier/Gold Standard (2017-04-21 12:43:57)  Upper Respiratory Infection, Adult An upper respiratory infection (URI) affects the nose, throat, and upper air passages. URIs are caused by germs (viruses). The most common type of URI is often called "the common cold." Medicines cannot cure URIs, but you can do things at home to relieve your symptoms. URIs usually get better within 7-10 days. Follow these instructions at home: Activity  Rest as needed.  If you have a fever, stay home from work or school until your fever is gone, or until your doctor says you may return to work or school. ? You should stay home until you cannot spread the infection anymore (you are not contagious). ? Your doctor may have you wear a face mask so you have less risk of spreading the infection. Relieving symptoms  Gargle with a salt-water mixture 3-4 times a day or as needed. To make a salt-water mixture, completely dissolve -1 tsp of salt in 1 cup of warm water.  Use a cool-mist humidifier to add moisture to the air. This can help you breathe more easily. Eating and drinking   Drink enough fluid to keep your pee (urine) pale yellow.  Eat soups and other clear broths. General instructions   Take over-the-counter and prescription medicines only as told by your doctor. These include cold medicines, fever reducers, and cough suppressants.  Do not use any products that contain nicotine or tobacco. These include cigarettes and e-cigarettes. If you need help quitting, ask your doctor.  Avoid being where people are smoking (avoid secondhand smoke).  Make  sure you get regular shots and get the flu shot every year.  Keep all follow-up  visits as told by your doctor. This is important. How to avoid spreading infection to others   Wash your hands often with soap and water. If you do not have soap and water, use hand sanitizer.  Avoid touching your mouth, face, eyes, or nose.  Cough or sneeze into a tissue or your sleeve or elbow. Do not cough or sneeze into your hand or into the air. Contact a doctor if:  You are getting worse, not better.  You have any of these: ? A fever. ? Chills. ? Brown or red mucus in your nose. ? Yellow or brown fluid (discharge)coming from your nose. ? Pain in your face, especially when you bend forward. ? Swollen neck glands. ? Pain with swallowing. ? White areas in the back of your throat. Get help right away if:  You have shortness of breath that gets worse.  You have very bad or constant: ? Headache. ? Ear pain. ? Pain in your forehead, behind your eyes, and over your cheekbones (sinus pain). ? Chest pain.  You have long-lasting (chronic) lung disease along with any of these: ? Wheezing. ? Long-lasting cough. ? Coughing up blood. ? A change in your usual mucus.  You have a stiff neck.  You have changes in your: ? Vision. ? Hearing. ? Thinking. ? Mood. Summary  An upper respiratory infection (URI) is caused by a germ called a virus. The most common type of URI is often called "the common cold."  URIs usually get better within 7-10 days.  Take over-the-counter and prescription medicines only as told by your doctor. This information is not intended to replace advice given to you by your health care provider. Make sure you discuss any questions you have with your health care provider. Document Released: 02/03/2008 Document Revised: 04/09/2017 Document Reviewed: 04/09/2017 Elsevier Interactive Patient Education  2019 [employer].

## 2018-10-17 ENCOUNTER — Telehealth: Payer: Self-pay | Admitting: Physician Assistant

## 2018-10-17 NOTE — Telephone Encounter (Signed)
Copied from CRM [id number]. Topic: Quick Communication - Rx Refill/Question >> Oct 17, 2018 11:45 AM Tonya Arnold wrote: Medication: Norethindrone-Ethinyl Estradiol-Fe Biphas (LO LOESTRIN FE) 1 MG-10 MCG / 10 MCG tablet - PA needed  Has the patient contacted their pharmacy? Yes - insurance stated they would be $161 and they need a PA to cover this Birth control (call [phone] or [phone] for the PA)    Preferred Pharmacy (with phone number or street name): [id number] [place], [place] - [address] AT [address] [facility] [address] [phone] (Phone) [phone] (Fax)    Agent: Please be advised that RX refills may take up to 3 business days. We ask that you follow-up with your pharmacy.

## 2018-10-20 NOTE — Telephone Encounter (Signed)
This pt husband called in and stated that she is not seeing Jarold Motto at [facility].  She wants to remain a pt at [facility].  They do not want to see any providers at [facility].  The the PA should have been sent to [facility] .

## 2018-10-20 NOTE — Telephone Encounter (Signed)
Patient is checking on the status of PA refill request.  Please advise.

## 2018-10-21 NOTE — Telephone Encounter (Signed)
Call to [facility] on file.  Does not cover pharmacy benefits.  Call to [facility] [phone] as previously listed.  This brand is not covered.  [facility] to [facility] a list of formularies that would be covered.  PA Ref #[id number].  [facility] states fax should be received in next few hours.

## 2018-10-25 NOTE — Telephone Encounter (Signed)
L/m for pt to c/b.   [facility].  Prescribed birth control not covered.  Info given on other formularies that are covered.  Need to confirm the following:  Has patient ever been on a birth control pill other than the recent birth control prescribed?  If so, did she not tolerate it or did she have to switch for some reason?

## 2018-10-25 NOTE — Telephone Encounter (Signed)
Have not yet received fax showing which med would be covered.  [facility].  Fax was sent to incorrect fax number.  They will resend to correct number.

## 2018-10-26 ENCOUNTER — Other Ambulatory Visit: Payer: Self-pay | Admitting: Family Medicine

## 2018-10-26 MED ORDER — NORETHINDRONE ACET-ETHINYL EST 1-20 MG-MCG PO TABS: 1.0000 | ORAL_TABLET | Freq: Every day | ORAL | 11 refills | Status: DC

## 2018-10-26 NOTE — Progress Notes (Signed)
OCP changed per insurance coverage

## 2018-10-26 NOTE — Telephone Encounter (Signed)
Pt was prescribed birth control by Raliegh Ip FNP at [place]. Not Tonya Arnold's pt.

## 2018-10-26 NOTE — Telephone Encounter (Signed)
Please advise. Patient was seen on 2/13 for a sick visit at [facility] but did not establish care there. Sam is still PCP but, PCP has been changed

## 2018-10-27 NOTE — Telephone Encounter (Signed)
Lengthy conversation with pt's husband.  Pt brought on phone and ok'ed discussion.  Reviewed birth control prescribed by Raliegh Ip.  Pt unsure why this was Rx vs another brand - confirms she has never had to stop an oral BC pill and has no known adverse reactions to them.  Reviewed with Dr. Leretha Pol who will send refill for approved formulary.

## 2018-10-27 NOTE — Telephone Encounter (Signed)
Pt husband advised of status of birth control per pharmacy.

## 2018-10-27 NOTE — Telephone Encounter (Signed)
Call to pharmacy.  Confirmed that new Rx was received and confirmed that it is covered by insurance.

## 2018-10-28 NOTE — Telephone Encounter (Signed)
Pt has another form of bc now

## 2018-12-02 ENCOUNTER — Other Ambulatory Visit: Payer: Self-pay | Admitting: Family Medicine

## 2018-12-11 IMAGING — DX DG CHEST 2V
2 series · 2 of 2 positions shown · non-contrast
Comparison: 06/13/2018

CLINICAL DATA: Persistent cough

EXAM:
CHEST - 2 VIEW

[chest pa]
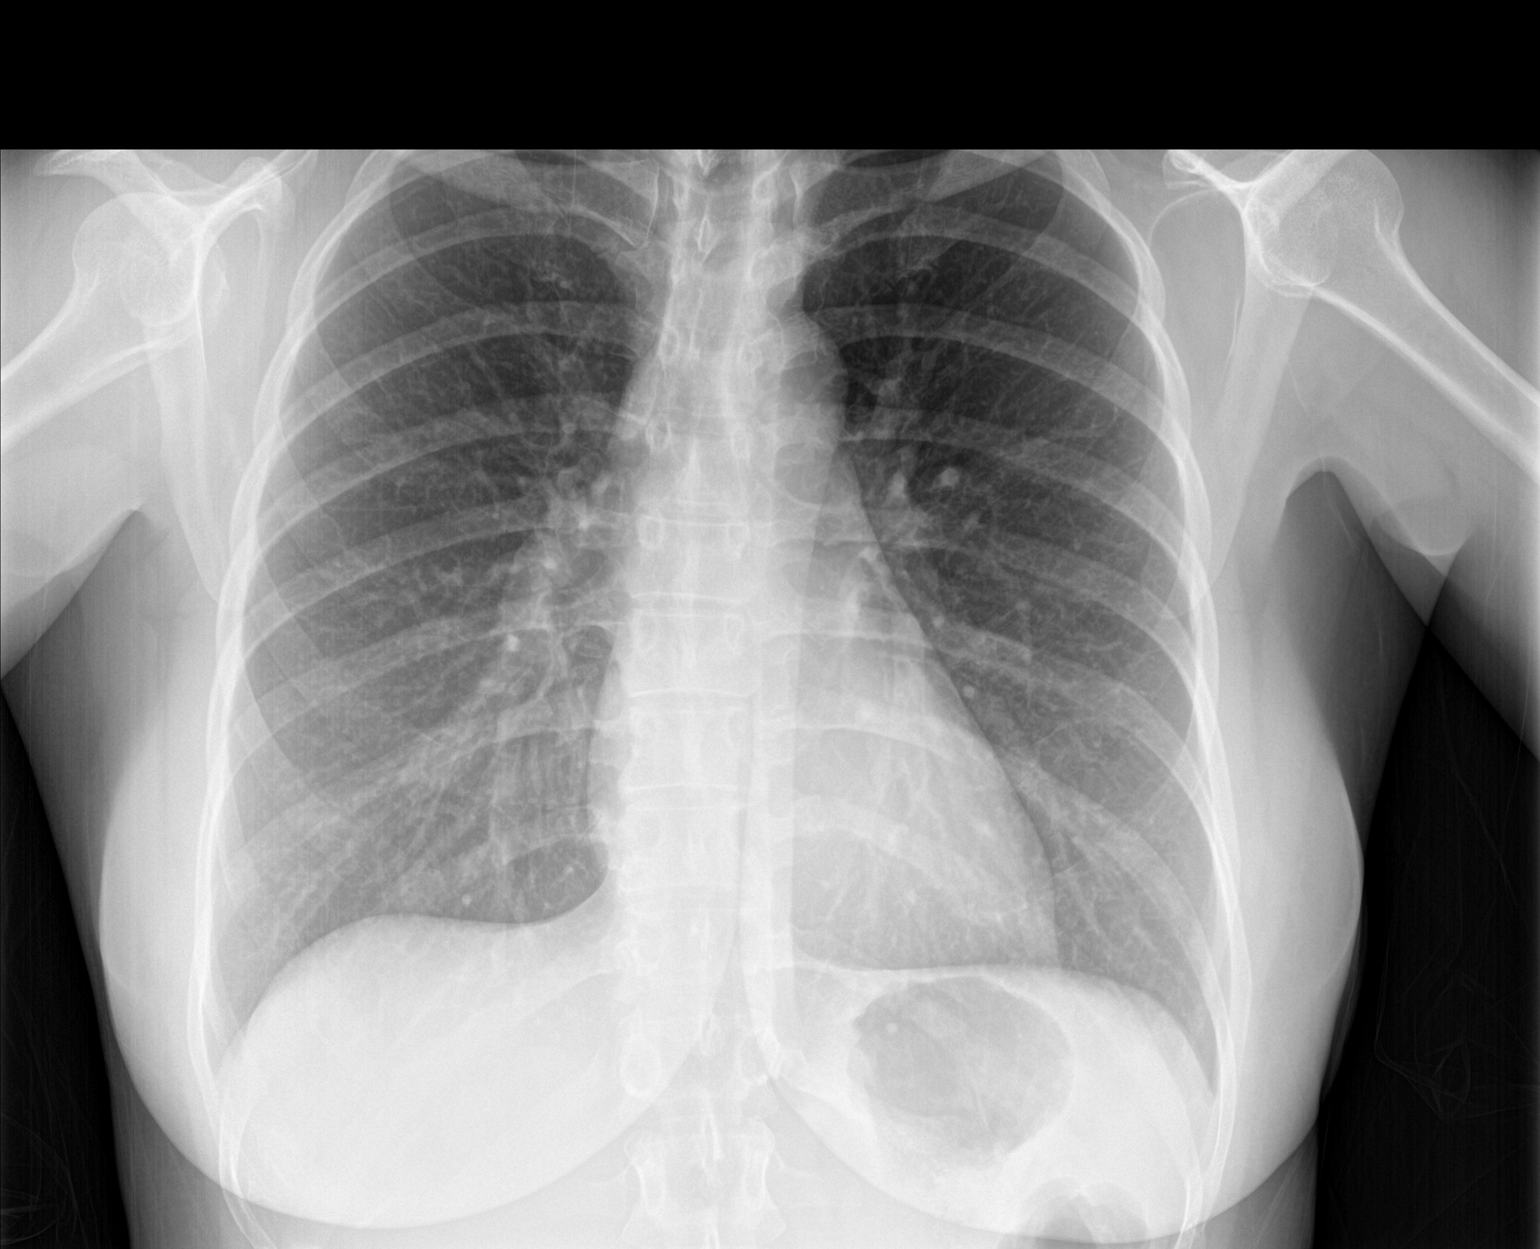

[chest lat]
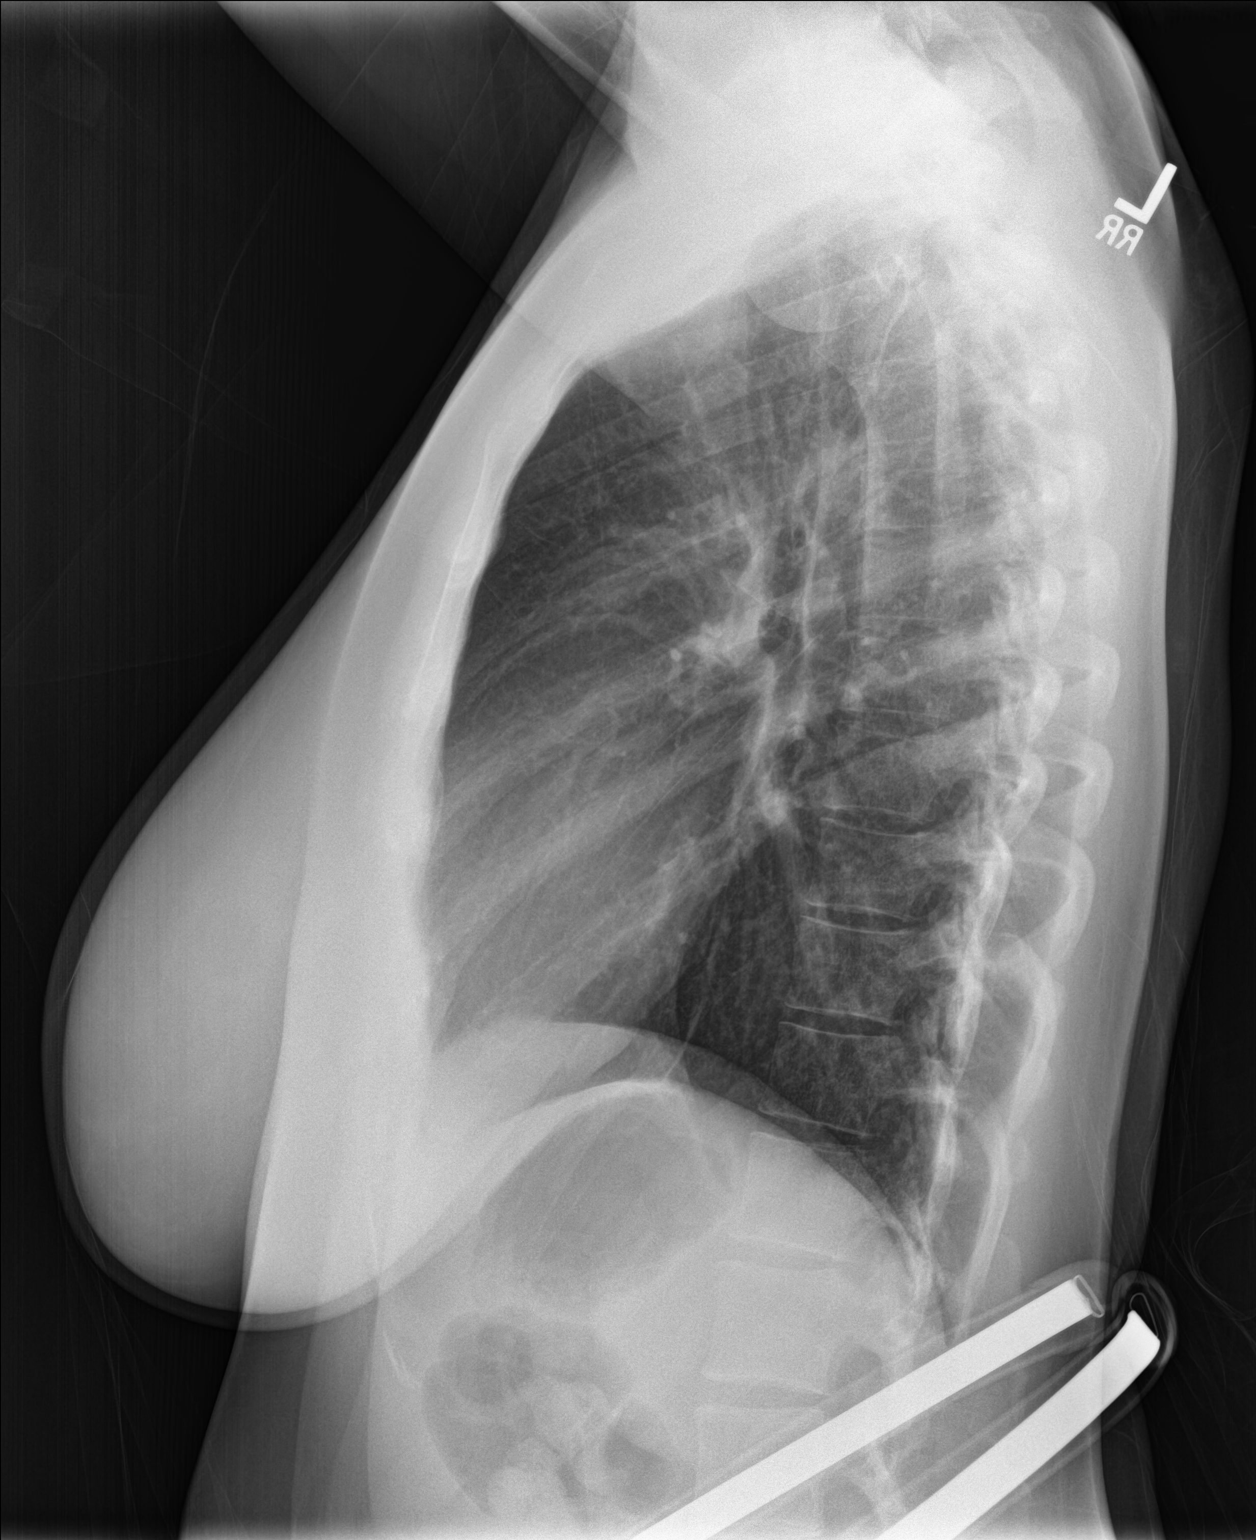

[2 of 2 positions shown; findings below may reference images not displayed]

FINDINGS: Normal heart size, mediastinal contours, and pulmonary vascularity.

Lungs clear.

No acute infiltrate, pleural effusion or pneumothorax.

Bones unremarkable.
IMPRESSION: No acute abnormalities.

## 2018-12-26 ENCOUNTER — Ambulatory Visit: Payer: BLUE CROSS/BLUE SHIELD | Admitting: Emergency Medicine

## 2018-12-26 ENCOUNTER — Encounter: Payer: Self-pay | Admitting: Emergency Medicine

## 2018-12-26 ENCOUNTER — Other Ambulatory Visit: Payer: Self-pay

## 2018-12-26 ENCOUNTER — Ambulatory Visit (INDEPENDENT_AMBULATORY_CARE_PROVIDER_SITE_OTHER): Payer: BLUE CROSS/BLUE SHIELD | Admitting: Emergency Medicine

## 2018-12-26 VITALS — BP 112/70 | HR 83 | Temp 99.2°F | Resp 16 | Ht 65.0 in | Wt 153.4 lb

## 2018-12-26 DIAGNOSIS — M545 Low back pain, unspecified: Secondary | ICD-10-CM

## 2018-12-26 DIAGNOSIS — Z3009 Encounter for other general counseling and advice on contraception: Secondary | ICD-10-CM | POA: Diagnosis not present

## 2018-12-26 MED ORDER — DESOGESTREL-ETHINYL ESTRADIOL 0.15-30 MG-MCG PO TABS: 1.0000 | ORAL_TABLET | Freq: Every day | ORAL | 11 refills | Status: DC

## 2018-12-26 MED ORDER — IBUPROFEN-FAMOTIDINE 800-26.6 MG PO TABS
1.0000 | ORAL_TABLET | Freq: Three times a day (TID) | ORAL | 3 refills | Status: DC | PRN
Start: 2018-12-26 — End: 2020-09-06

## 2018-12-26 NOTE — Patient Instructions (Addendum)
   If you have lab work done today you will be contacted with your lab results within the next 2 weeks.  If you have not heard from us then please contact us. The fastest way to get your results is to register for My Chart.   IF you received an x-ray today, you will receive an invoice from [facility]. Please contact [facility] at [phone] with questions or concerns regarding your invoice.   IF you received labwork today, you will receive an invoice from [facility]. Please contact [facility] at [phone] with questions or concerns regarding your invoice.   Our billing staff will not be able to assist you with questions regarding bills from these companies.  You will be contacted with the lab results as soon as they are available. The fastest way to get your results is to activate your My Chart account. Instructions are located on the last page of this paperwork. If you have not heard from us regarding the results in 2 weeks, please contact this office.     Acute Back Pain, Adult Acute back pain is sudden and usually short-lived. It is often caused by an injury to the muscles and tissues in the back. The injury may result from:  A muscle or ligament getting overstretched or torn (strained). Ligaments are tissues that connect bones to each other. Lifting something improperly can cause a back strain.  Wear and tear (degeneration) of the spinal disks. Spinal disks are circular tissue that provides cushioning between the bones of the spine (vertebrae).  Twisting motions, such as while playing sports or doing yard work.  A hit to the back.  Arthritis. You may have a physical exam, lab tests, and imaging tests to find the cause of your pain. Acute back pain usually goes away with rest and home care. Follow these instructions at home: Managing pain, stiffness, and swelling  Take over-the-counter and prescription medicines only as told by your health care  provider.  Your health care provider may recommend applying ice during the first 24-48 hours after your pain starts. To do this: ? Put ice in a plastic bag. ? Place a towel between your skin and the bag. ? Leave the ice on for 20 minutes, 2-3 times a day.  If directed, apply heat to the affected area as often as told by your health care provider. Use the heat source that your health care provider recommends, such as a moist heat pack or a heating pad. ? Place a towel between your skin and the heat source. ? Leave the heat on for 20-30 minutes. ? Remove the heat if your skin turns bright red. This is especially important if you are unable to feel pain, heat, or cold. You have a greater risk of getting burned. Activity   Do not stay in bed. Staying in bed for more than 1-2 days can delay your recovery.  Sit up and stand up straight. Avoid leaning forward when you sit, or hunching over when you stand. ? If you work at a desk, sit close to it so you do not need to lean over. Keep your chin tucked in. Keep your neck drawn back, and keep your elbows bent at a right angle. Your arms should look like the letter "L." ? Sit high and close to the steering wheel when you drive. Add lower back (lumbar) support to your car seat, if needed.  Take short walks on even surfaces as soon as you are able. Try   to increase the length of time you walk each day.  Do not sit, drive, or stand in one place for more than 30 minutes at a time. Sitting or standing for long periods of time can put stress on your back.  Do not drive or use heavy machinery while taking prescription pain medicine.  Use proper lifting techniques. When you bend and lift, use positions that put less stress on your back: ? Bend your knees. ? Keep the load close to your body. ? Avoid twisting.  Exercise regularly as told by your health care provider. Exercising helps your back heal faster and helps prevent back injuries by keeping muscles  strong and flexible.  Work with a physical therapist to make a safe exercise program, as recommended by your health care provider. Do any exercises as told by your physical therapist. Lifestyle  Maintain a healthy weight. Extra weight puts stress on your back and makes it difficult to have good posture.  Avoid activities or situations that make you feel anxious or stressed. Stress and anxiety increase muscle tension and can make back pain worse. Learn ways to manage anxiety and stress, such as through exercise. General instructions  Sleep on a firm mattress in a comfortable position. Try lying on your side with your knees slightly bent. If you lie on your back, put a pillow under your knees.  Follow your treatment plan as told by your health care provider. This may include: ? Cognitive or behavioral therapy. ? Acupuncture or massage therapy. ? Meditation or yoga. Contact a health care provider if:  You have pain that is not relieved with rest or medicine.  You have increasing pain going down into your legs or buttocks.  Your pain does not improve after 2 weeks.  You have pain at night.  You lose weight without trying.  You have a fever or chills. Get help right away if:  You develop new bowel or bladder control problems.  You have unusual weakness or numbness in your arms or legs.  You develop nausea or vomiting.  You develop abdominal pain.  You feel faint. Summary  Acute back pain is sudden and usually short-lived.  Use proper lifting techniques. When you bend and lift, use positions that put less stress on your back.  Take over-the-counter and prescription medicines and apply heat or ice as directed by your health care provider. This information is not intended to replace advice given to you by your health care provider. Make sure you discuss any questions you have with your health care provider. Document Released: 08/17/2005 Document Revised: 03/24/2018 Document  Reviewed: 03/31/2017 Elsevier Interactive Patient Education  2019 Elsevier Inc.  

## 2018-12-27 ENCOUNTER — Encounter: Payer: Self-pay | Admitting: Emergency Medicine

## 2018-12-27 DIAGNOSIS — M545 Low back pain, unspecified: Secondary | ICD-10-CM | POA: Insufficient documentation

## 2018-12-27 DIAGNOSIS — G8929 Other chronic pain: Secondary | ICD-10-CM | POA: Insufficient documentation

## 2018-12-27 NOTE — Progress Notes (Signed)
Tonya Arnold 33 y.o.   Chief Complaint  Patient presents with  . Back Pain    and leg pain for a few months, and having long periods lasting 4-7 days 3 times in one month    HISTORY OF PRESENT ILLNESS: This is a 33 y.o. female complaining of lumbar pain for couple years.  Pain is sharp and intermittent with radiation down the back of the legs and not associated with any other symptoms.  Pain is better with rest but worse with activity.  Denies any bowel or bladder dysfunction.  Has seen doctors about this and has had work-up including x-rays and blood work with normal results.  Married with a daughter and a full-time job.  Denies injuries.  Denies neurological symptoms to her legs.  Denies any other significant symptoms. Was also recently started on birth control pill and is having some menstrual irregularities.  Wants to have her birth control pill changed.  HPI   Prior to Admission medications   Medication Sig Start Date End Date Taking? Authorizing Provider  desogestrel-ethinyl estradiol (RECLIPSEN) 0.15-30 MG-MCG tablet Take 1 tablet by mouth daily. 12/26/18   Georgina Quint, MD  Ibuprofen-Famotidine (DUEXIS) 800-26.6 MG TABS Take 1 tablet by mouth every 8 (eight) hours as needed. 12/26/18   Georgina Quint, MD  JUNEL 1/20 1-20 MG-MCG tablet TAKE 1 TABLET BY MOUTH EVERY DAY 12/03/18   Georgina Quint, MD    Allergies  Allergen Reactions  . Amoxicillin Rash    Pt states she is not allergic to penicillin or any other antibiotic    Patient Active Problem List   Diagnosis Date Noted  . Bruising, spontaneous 08/26/2017  . RLS (restless legs syndrome) 08/26/2017    Past Medical History:  Diagnosis Date  . History of chicken pox   . History of fainting spells of unknown cause   . Migraine     Past Surgical History:  Procedure Laterality Date  . CESAREAN SECTION    . CESAREAN SECTION  07/10/2012   Procedure: CESAREAN SECTION;  Surgeon: Mickel Baas, MD;  Location: [facility];  Service: Obstetrics;  Laterality: N/A;  Repeat cesarean section with delivery of baby girl at 1700.  Apgars 8/9.    Social History   Socioeconomic History  . Marital status: Married    Spouse name: Not on file  . Number of children: 2  . Years of education: Not on file  . Highest education level: Not on file  Occupational History  . Not on file  Social Needs  . Financial resource strain: Not on file  . Food insecurity:    Worry: Not on file    Inability: Not on file  . Transportation needs:    Medical: Not on file    Non-medical: Not on file  Tobacco Use  . Smoking status: Never Smoker  . Smokeless tobacco: Never Used  Substance and Sexual Activity  . Alcohol use: Yes    Comment: Occ  . Drug use: No  . Sexual activity: Yes  Lifestyle  . Physical activity:    Days per week: Not on file    Minutes per session: Not on file  . Stress: Not on file  Relationships  . Social connections:    Talks on phone: Not on file    Gets together: Not on file    Attends religious service: Not on file    Active member of club or organization: Not on file  Attends meetings of clubs or organizations: Not on file    Relationship status: Not on file  . Intimate partner violence:    Fear of current or ex partner: Not on file    Emotionally abused: Not on file    Physically abused: Not on file    Forced sexual activity: Not on file  Other Topics Concern  . Not on file  Social History Narrative   Married   Geophysical data processor    Family History  Problem Relation Age of Onset  . Asthma Mother   . Diabetes Father   . Hypertension Father   . Kidney disease Father   . Heart disease Father      Review of Systems  Constitutional: Negative.  Negative for chills, fever and weight loss.  HENT: Negative.   Eyes: Negative.   Respiratory: Negative.  Negative for cough and shortness of breath.   Cardiovascular: Negative.  Negative for chest pain,  palpitations, claudication and leg swelling.  Gastrointestinal: Negative.  Negative for abdominal pain, diarrhea, nausea and vomiting.  Genitourinary: Negative.  Negative for dysuria, flank pain, frequency, hematuria and urgency.  Skin: Negative.  Negative for rash.  Neurological: Negative.  Negative for dizziness, sensory change, speech change, focal weakness and headaches.  Endo/Heme/Allergies: Negative.   All other systems reviewed and are negative.   Vitals:   12/26/18 1412  BP: 112/70  Pulse: 83  Resp: 16  Temp: 99.2 F (37.3 C)  SpO2: 98%    Physical Exam Vitals signs reviewed.  Constitutional:      Appearance: Normal appearance.  HENT:     Head: Normocephalic and atraumatic.     Nose: Nose normal.     Mouth/Throat:     Mouth: Mucous membranes are moist.     Pharynx: Oropharynx is clear.  Eyes:     Extraocular Movements: Extraocular movements intact.     Conjunctiva/sclera: Conjunctivae normal.     Pupils: Pupils are equal, round, and reactive to light.  Neck:     Musculoskeletal: Normal range of motion and neck supple.  Cardiovascular:     Rate and Rhythm: Normal rate and regular rhythm.     Heart sounds: Normal heart sounds.  Pulmonary:     Effort: Pulmonary effort is normal.     Breath sounds: Normal breath sounds.  Abdominal:     General: Abdomen is flat. Bowel sounds are normal. There is no distension.     Palpations: Abdomen is soft. There is no mass.     Tenderness: There is no abdominal tenderness.  Musculoskeletal: Normal range of motion.  Skin:    General: Skin is warm and dry.     Capillary Refill: Capillary refill takes less than 2 seconds.  Neurological:     General: No focal deficit present.     Mental Status: She is alert and oriented to person, place, and time.     Sensory: No sensory deficit.     Motor: No weakness.     Coordination: Coordination normal.     Gait: Gait normal.     Deep Tendon Reflexes: Reflexes normal.  Psychiatric:         Mood and Affect: Mood normal.        Behavior: Behavior normal.    A total of 25 minutes was spent in the room with the patient, greater than 50% of which was in counseling/coordination of care regarding differential diagnosis, treatment, medications, need for diagnostic imaging, prognosis and need for follow-up.  ASSESSMENT & PLAN: Tonya Arnold was seen today for back pain.  Diagnoses and all orders for this visit:  Lumbar pain -     Ibuprofen-Famotidine (DUEXIS) 800-26.6 MG TABS; Take 1 tablet by mouth every 8 (eight) hours as needed. -     MR Lumbar Spine Wo Contrast; Future  Counseling for birth control, oral contraceptives -     desogestrel-ethinyl estradiol (RECLIPSEN) 0.15-30 MG-MCG tablet; Take 1 tablet by mouth daily.    Patient Instructions       If you have lab work done today you will be contacted with your lab results within the next 2 weeks.  If you have not heard from us then please contact us. The fastest way to get your results is to register for My Chart.   IF you received an x-ray today, you will [facility] an [facility]. Please contact [facility] at [phone] with questions or concerns regarding your invoice.   IF you received labwork today, you will receive an invoice from [facility]. Please contact [facility] at [phone] with questions or concerns regarding your invoice.   Our billing staff will not be able to assist you with questions regarding bills from these companies.  You will be contacted with the lab results as soon as they are available. The fastest way to get your results is to activate your My Chart account. Instructions are located on the last page of this paperwork. If you have not heard from us regarding the results in 2 weeks, please contact this office.     Acute Back Pain, Adult Acute back pain is sudden and usually short-lived. It is often caused by an injury to the muscles and tissues in the back. The  injury may result from:  A muscle or ligament getting overstretched or torn (strained). Ligaments are tissues that connect bones to each other. Lifting something improperly can cause a back strain.  Wear and tear (degeneration) of the spinal disks. Spinal disks are circular tissue that provides cushioning between the bones of the spine (vertebrae).  Twisting motions, such as while playing sports or doing yard work.  A hit to the back.  Arthritis. You may have a physical exam, lab tests, and imaging tests to find the cause of your pain. Acute back pain usually goes away with rest and home care. Follow these instructions at home: Managing pain, stiffness, and swelling  Take over-the-counter and prescription medicines only as told by your health care provider.  Your health care provider may recommend applying ice during the first 24-48 hours after your pain starts. To do this: ? Put ice in a plastic bag. ? Place a towel between your skin and the bag. ? Leave the ice on for 20 minutes, 2-3 times a day.  If directed, apply heat to the affected area as often as told by your health care provider. Use the heat source that your health care provider recommends, such as a moist heat pack or a heating pad. ? Place a towel between your skin and the heat source. ? Leave the heat on for 20-30 minutes. ? Remove the heat if your skin turns bright red. This is especially important if you are unable to feel pain, heat, or cold. You have a greater risk of getting burned. Activity   Do not stay in bed. Staying in bed for more than 1-2 days can delay your recovery.  Sit up and stand up straight. Avoid leaning forward when you sit, or hunching over when  you stand. ? If you work at a desk, sit close to it so you do not need to lean over. Keep your chin tucked in. Keep your neck drawn back, and keep your elbows bent at a right angle. Your arms should look like the letter "L." ? Sit high and close to the  steering wheel when you drive. Add lower back (lumbar) support to your car seat, if needed.  Take short walks on even surfaces as soon as you are able. Try to increase the length of time you walk each day.  Do not sit, drive, or stand in one place for more than 30 minutes at a time. Sitting or standing for long periods of time can put stress on your back.  Do not drive or use heavy machinery while taking prescription pain medicine.  Use proper lifting techniques. When you bend and lift, use positions that put less stress on your back: ? [place] your knees. ? Keep the load close to your body. ? Avoid twisting.  Exercise regularly as told by your health care provider. Exercising helps your back heal faster and helps prevent back injuries by keeping muscles strong and flexible.  Work with a physical therapist to make a safe exercise program, as recommended by your health care provider. Do any exercises as told by your physical therapist. Lifestyle  Maintain a healthy weight. Extra weight puts stress on your back and makes it difficult to have good posture.  Avoid activities or situations that make you feel anxious or stressed. Stress and anxiety increase muscle tension and can make back pain worse. Learn ways to manage anxiety and stress, such as through exercise. General instructions  Sleep on a firm mattress in a comfortable position. Try lying on your side with your knees slightly bent. If you lie on your back, put a pillow under your knees.  Follow your treatment plan as told by your health care provider. This may include: ? Cognitive or behavioral therapy. ? Acupuncture or massage therapy. ? Meditation or yoga. Contact a health care provider if:  You have pain that is not relieved with rest or medicine.  You have increasing pain going down into your legs or buttocks.  Your pain does not improve after 2 weeks.  You have pain at night.  You lose weight without trying.  You have  a fever or chills. Get help right away if:  You develop new bowel or bladder control problems.  You have unusual weakness or numbness in your arms or legs.  You develop nausea or vomiting.  You develop abdominal pain.  You feel faint. Summary  Acute back pain is sudden and usually short-lived.  Use proper lifting techniques. When you bend and lift, use positions that put less stress on your back.  Take over-the-counter and prescription medicines and apply heat or ice as directed by your health care provider. This information is not intended to replace advice given to you by your health care provider. Make sure you discuss any questions you have with your health care provider. Document Released: 08/17/2005 Document Revised: 03/24/2018 Document Reviewed: 03/31/2017 Elsevier Interactive Patient Education  2019 [employer]vier Inc.      Edwina Barth, MD [facility]

## 2018-12-30 ENCOUNTER — Other Ambulatory Visit: Payer: Self-pay | Admitting: Emergency Medicine

## 2018-12-30 DIAGNOSIS — M545 Low back pain, unspecified: Secondary | ICD-10-CM

## 2018-12-30 NOTE — Telephone Encounter (Signed)
Spoke with Rosanne Ashing who states pt has just picked up her RX.

## 2019-06-16 ENCOUNTER — Other Ambulatory Visit: Payer: Self-pay

## 2019-06-16 DIAGNOSIS — Z20822 Contact with and (suspected) exposure to covid-19: Secondary | ICD-10-CM

## 2019-06-18 LAB — NOVEL CORONAVIRUS, NAA: SARS-CoV-2, NAA: NOT DETECTED

## 2019-06-19 ENCOUNTER — Telehealth: Payer: Self-pay | Admitting: *Deleted

## 2019-06-19 ENCOUNTER — Telehealth: Payer: BLUE CROSS/BLUE SHIELD | Admitting: Emergency Medicine

## 2019-06-19 ENCOUNTER — Other Ambulatory Visit: Payer: Self-pay

## 2019-06-19 NOTE — Telephone Encounter (Signed)
Called patient this morning to triage for telemedicine appointment. Left message voice mail to call back to be triaged before 9:40. Later I checked the DAR, the patient cancelled appointment, the telemedicine was for [place] exposure.

## 2019-10-25 ENCOUNTER — Other Ambulatory Visit: Payer: Self-pay

## 2019-10-25 ENCOUNTER — Telehealth (INDEPENDENT_AMBULATORY_CARE_PROVIDER_SITE_OTHER): Payer: BC Managed Care – PPO | Admitting: Emergency Medicine

## 2019-10-25 ENCOUNTER — Encounter: Payer: Self-pay | Admitting: Emergency Medicine

## 2019-10-25 VITALS — Ht 64.0 in | Wt 167.0 lb

## 2019-10-25 DIAGNOSIS — J029 Acute pharyngitis, unspecified: Secondary | ICD-10-CM

## 2019-10-25 MED ORDER — AZITHROMYCIN 250 MG PO TABS: ORAL_TABLET | ORAL | 0 refills | Status: DC

## 2019-10-25 NOTE — Progress Notes (Signed)
Telemedicine Encounter- SOAP NOTE Established Patient MyChart virtual visit. This video telephone encounter was conducted with the patient's (or proxy's) verbal consent via video audio telecommunications: yes/no: Yes Patient was instructed to have this encounter in a suitably private space; and to only have persons present to whom they give permission to participate. In addition, patient identity was confirmed by use of name plus two identifiers (DOB and address).  I discussed the limitations, risks, security and privacy concerns of performing an evaluation and management service by telephone and the availability of in person appointments. I also discussed with the patient that there may be a patient responsible charge related to this service. The patient expressed understanding and agreed to proceed.  I spent a total of TIME; 0 MIN TO 60 MIN: 15 minutes talking with the patient or their proxy.  Chief Complaint  Patient presents with  . Sore Throat    tonsils swollen on right side, hard to swallow since yesterday  . Ear Pain    right    Subjective   Tonya Arnold is a 34 y.o. female established patient. Telephone visit today complaining of sore throat since yesterday.  Slowly getting worse.  Steady pain radiating to right ear.  Some fever and chills but no other significant symptoms.  Painful swallowing but still able to eat and drink without nausea or vomiting.  HPI   There are no problems to display for this patient.   Past Medical History:  Diagnosis Date  . History of chicken pox   . History of fainting spells of unknown cause   . Migraine     Current Outpatient Medications  Medication Sig Dispense Refill  . desogestrel-ethinyl estradiol (RECLIPSEN) 0.15-30 MG-MCG tablet Take 1 tablet by mouth daily. 1 Package 11  . Ibuprofen-Famotidine (DUEXIS) 800-26.6 MG TABS Take 1 tablet by mouth every 8 (eight) hours as needed. 30 tablet 3  . JUNEL 1/20 1-20 MG-MCG  tablet TAKE 1 TABLET BY MOUTH EVERY DAY (Patient not taking: Reported on 10/25/2019) 21 tablet 11   No current facility-administered medications for this visit.    Allergies  Allergen Reactions  . Amoxicillin Rash    Pt states she is not allergic to penicillin or any other antibiotic    Social History   Socioeconomic History  . Marital status: Married    Spouse name: Not on file  . Number of children: 2  . Years of education: Not on file  . Highest education level: Not on file  Occupational History  . Not on file  Tobacco Use  . Smoking status: Never Smoker  . Smokeless tobacco: Never Used  Substance and Sexual Activity  . Alcohol use: Yes    Comment: Occ  . Drug use: No  . Sexual activity: Yes  Other Topics Concern  . Not on file  Social History Narrative   Married   Geophysical data processor   Social Determinants of Health   Financial Resource Strain:   . Difficulty of Paying Living Expenses: Not on file  Food Insecurity:   . Worried About Programme researcher, broadcasting/film/video in the Last Year: Not on file  . Ran Out of Food in the Last Year: Not on file  Transportation Needs:   . Lack of Transportation (Medical): Not on file  . Lack of Transportation (Non-Medical): Not on file  Physical Activity:   . Days of Exercise per Week: Not on file  . Minutes of Exercise per Session: Not on file  Stress:   . Feeling of Stress : Not on file  Social Connections:   . Frequency of Communication with Friends and Family: Not on file  . Frequency of Social Gatherings with Friends and Family: Not on file  . Attends Religious Services: Not on file  . Active Member of Clubs or Organizations: Not on file  . Attends Archivist Meetings: Not on file  . Marital Status: Not on file  Intimate Partner Violence:   . Fear of Current or Ex-Partner: Not on file  . Emotionally Abused: Not on file  . Physically Abused: Not on file  . Sexually Abused: Not on file    Review of Systems   Constitutional: Negative.  Negative for chills and fever.  HENT: Positive for ear pain and sore throat. Negative for congestion.   Respiratory: Negative.  Negative for cough and shortness of breath.   Cardiovascular: Negative.  Negative for chest pain and palpitations.  Gastrointestinal: Negative.  Negative for abdominal pain, diarrhea, nausea and vomiting.  Genitourinary: Negative.  Negative for dysuria.  Musculoskeletal: Negative.  Negative for myalgias and neck pain.  Skin: Negative.  Negative for rash.  Neurological: Negative.  Negative for dizziness and headaches.  All other systems reviewed and are negative.   Objective  Alert and oriented x3 in no apparent respiratory distress. Vitals as reported by the patient: Today's Vitals   10/25/19 1512  Weight: 167 lb (75.8 kg)  Height: 5\' 4"  (1.626 m)    There are no diagnoses linked to this encounter.   Tonya Arnold was seen today for sore throat and ear pain.  Diagnoses and all orders for this visit:  Sore throat  Acute pharyngitis, unspecified etiology -     azithromycin (ZITHROMAX) 250 MG tablet; Sig as indicated    Clinically stable.  No red flag signs or symptoms.  Advised to take antibiotic as prescribed and monitor symptoms. Advised to contact the office if no better or worse in the next several days.  I discussed the assessment and treatment plan with the patient. The patient was provided an opportunity to ask questions and all were answered. The patient agreed with the plan and demonstrated an understanding of the instructions.   The patient was advised to call back or seek an in-person evaluation if the symptoms worsen or if the condition fails to improve as anticipated.  I provided 15 minutes of non-face-to-face time during this encounter.  Horald Pollen, MD  Primary Care at [facility]

## 2019-10-26 ENCOUNTER — Telehealth: Payer: BC Managed Care – PPO | Admitting: Emergency Medicine

## 2020-01-01 ENCOUNTER — Other Ambulatory Visit: Payer: Self-pay

## 2020-01-01 ENCOUNTER — Encounter: Payer: BC Managed Care – PPO | Admitting: Emergency Medicine

## 2020-01-03 ENCOUNTER — Ambulatory Visit (HOSPITAL_COMMUNITY)
Admission: EM | Admit: 2020-01-03 | Discharge: 2020-01-03 | Disposition: A | Discharge status: Home / Self Care | Payer: Self-pay | Attending: Urgent Care | Admitting: Urgent Care

## 2020-01-03 ENCOUNTER — Ambulatory Visit: Payer: BC Managed Care – PPO | Admitting: Emergency Medicine

## 2020-01-03 ENCOUNTER — Other Ambulatory Visit: Payer: Self-pay

## 2020-01-03 DIAGNOSIS — R52 Pain, unspecified: Secondary | ICD-10-CM

## 2020-01-03 DIAGNOSIS — R0981 Nasal congestion: Secondary | ICD-10-CM

## 2020-01-03 DIAGNOSIS — J069 Acute upper respiratory infection, unspecified: Secondary | ICD-10-CM

## 2020-01-03 MED ORDER — PROMETHAZINE-DM 6.25-15 MG/5ML PO SYRP: 5.0000 mL | ORAL_SOLUTION | Freq: Every evening | ORAL | 0 refills | Status: DC | PRN

## 2020-01-03 MED ORDER — BENZONATATE 100 MG PO CAPS: 100.0000 mg | ORAL_CAPSULE | Freq: Three times a day (TID) | ORAL | 0 refills | Status: DC | PRN

## 2020-01-03 MED ORDER — PREDNISONE 20 MG PO TABS: ORAL_TABLET | ORAL | 0 refills | Status: DC

## 2020-01-03 NOTE — ED Provider Notes (Signed)
[facility]   MRN: [id number] DOB: [birth year]  Subjective:   Tonya Arnold is a 34 y.o. female presenting for 3-day history of acute onset malaise, ROS as below.  Patient has been using Zyrtec with minimal relief.  She had COVID-19 in October 2020.  She got tested for COVID-19 this past Monday and was negative.  Unfortunately, her PCP was still unwilling to see her.  She has missed work out of concern for COVID-19 or passing on another virus or bacterial infection to her coworkers.  Denies history of asthma.  Has history of allergies but states that this feels different.  No current facility-administered medications for this encounter.  Current Outpatient Medications:  .  desogestrel-ethinyl estradiol (RECLIPSEN) 0.15-30 MG-MCG tablet, Take 1 tablet by mouth daily., Disp: 1 Package, Rfl: 11 .  Ibuprofen-Famotidine (DUEXIS) 800-26.6 MG TABS, Take 1 tablet by mouth every 8 (eight) hours as needed., Disp: 30 tablet, Rfl: 3 .  JUNEL 1/20 1-20 MG-MCG tablet, TAKE 1 TABLET BY MOUTH EVERY DAY (Patient not taking: Reported on 10/25/2019), Disp: 21 tablet, Rfl: 11    Allergies  Allergen Reactions  . Amoxicillin Rash    Pt states she is not allergic to penicillin or any other antibiotic    Past Medical History:  Diagnosis Date  . History of chicken pox   . History of fainting spells of unknown cause   . Migraine      Past Surgical History:  Procedure Laterality Date  . CESAREAN SECTION    . CESAREAN SECTION  07/10/2012   Procedure: CESAREAN SECTION;  Surgeon: Mickel Baas, MD;  Location: [facility];  Service: Obstetrics;  Laterality: N/A;  Repeat cesarean section with delivery of baby girl at 1700.  Apgars 8/9.    Family History  Problem Relation Age of Onset  . Asthma Mother   . Diabetes Father   . Hypertension Father   . Kidney disease Father   . Heart disease Father     Social History   Tobacco Use  . Smoking status: Never Smoker  . Smokeless tobacco:  Never Used  Substance Use Topics  . Alcohol use: Yes    Comment: Occ  . Drug use: No    Review of Systems  Constitutional: Positive for malaise/fatigue. Negative for fever.  HENT: Positive for congestion and sore throat. Negative for ear pain and sinus pain.   Eyes: Negative for discharge and redness.  Respiratory: Positive for cough. Negative for hemoptysis, shortness of breath and wheezing.   Cardiovascular: Negative for chest pain.  Gastrointestinal: Negative for abdominal pain, diarrhea, nausea and vomiting.  Genitourinary: Negative for dysuria, flank pain and hematuria.  Musculoskeletal: Positive for myalgias.  Skin: Negative for rash.  Neurological: Negative for dizziness, weakness and headaches.  Psychiatric/Behavioral: Negative for depression and substance abuse.     Objective:   Vitals: BP 117/82   Pulse 78   Temp 98.5 F (36.9 C)   SpO2 100%   Physical Exam Constitutional:      General: She is not in acute distress.    Appearance: Normal appearance. She is well-developed. She is not ill-appearing, toxic-appearing or diaphoretic.  HENT:     Head: Normocephalic and atraumatic.     Right Ear: Tympanic membrane and ear canal normal. No drainage or tenderness. No middle ear effusion. Tympanic membrane is not erythematous.     Left Ear: Tympanic membrane and ear canal normal. No drainage or tenderness.  No middle ear effusion. Tympanic membrane is not  erythematous.     Nose: Nose normal. No congestion or rhinorrhea.     Mouth/Throat:     Mouth: Mucous membranes are moist. No oral lesions.     Pharynx: Oropharynx is clear. No pharyngeal swelling, oropharyngeal exudate, posterior oropharyngeal erythema or uvula swelling.     Tonsils: No tonsillar exudate or tonsillar abscesses.  Eyes:     General: No scleral icterus.       Right eye: No discharge.        Left eye: No discharge.     Extraocular Movements: Extraocular movements intact.     Right eye: Normal  extraocular motion.     Left eye: Normal extraocular motion.     Conjunctiva/sclera: Conjunctivae normal.     Pupils: Pupils are equal, round, and reactive to light.  Cardiovascular:     Rate and Rhythm: Normal rate and regular rhythm.     Pulses: Normal pulses.     Heart sounds: Normal heart sounds. No murmur. No friction rub. No gallop.   Pulmonary:     Effort: Pulmonary effort is normal. No respiratory distress.     Breath sounds: Normal breath sounds. No stridor. No wheezing, rhonchi or rales.  Musculoskeletal:     Cervical back: Normal range of motion and neck supple.  Lymphadenopathy:     Cervical: No cervical adenopathy.  Skin:    General: Skin is warm and dry.     Findings: No rash.  Neurological:     General: No focal deficit present.     Mental Status: She is alert and oriented to person, place, and time.  Psychiatric:        Mood and Affect: Mood normal.        Behavior: Behavior normal.        Thought Content: Thought content normal.        Judgment: Judgment normal.      Assessment and Plan :   PDMP not reviewed this encounter.  1. Viral URI with cough   2. Nasal congestion   3. Body aches     Counseled against influenza testing given lack of cases this past season.  She is also past the typical timeframe where an antiviral for influenza would help.  Recommended supportive care.  Patient requested aggressive management and therefore counseled that we could use prednisone to help with the respiratory symptoms.  If patient is still symptomatic through the weekend or early next week, recommend recheck and consider antibiotics for coverage of a sinus infection at that point.  Counseled patient on potential for adverse effects with medications prescribed/recommended today, ER and return-to-clinic precautions discussed, patient verbalized understanding.    Jaynee Eagles, PA-C 01/03/20 1028

## 2020-01-03 NOTE — ED Triage Notes (Addendum)
Pt c/o fever, nausea, congestion, body aches since Sunday. Had a negative COVID test on Monday, but PCP would not see her due to continued symptoms. Pt had COVID in October, states this is different.

## 2020-05-15 ENCOUNTER — Other Ambulatory Visit (HOSPITAL_COMMUNITY)
Admission: RE | Admit: 2020-05-15 | Discharge: 2020-05-15 | Disposition: A | Discharge status: Home / Self Care | Payer: BC Managed Care – PPO | Source: Ambulatory Visit | Attending: Registered Nurse | Admitting: Registered Nurse

## 2020-05-15 ENCOUNTER — Other Ambulatory Visit: Payer: Self-pay

## 2020-05-15 ENCOUNTER — Ambulatory Visit (INDEPENDENT_AMBULATORY_CARE_PROVIDER_SITE_OTHER): Payer: BC Managed Care – PPO | Admitting: Registered Nurse

## 2020-05-15 ENCOUNTER — Encounter: Payer: Self-pay | Admitting: Registered Nurse

## 2020-05-15 VITALS — BP 132/84 | HR 77 | Temp 98.0°F | Resp 18 | Ht 64.0 in | Wt 166.0 lb

## 2020-05-15 DIAGNOSIS — R109 Unspecified abdominal pain: Secondary | ICD-10-CM | POA: Insufficient documentation

## 2020-05-15 DIAGNOSIS — N926 Irregular menstruation, unspecified: Secondary | ICD-10-CM | POA: Diagnosis not present

## 2020-05-15 LAB — POCT CBC
Granulocyte percent: 60 %G (ref 37–80)
HCT, POC: 39.4 % (ref 29–41)
Hemoglobin: 12.9 g/dL (ref 11–14.6)
Lymph, poc: 1.9 (ref 0.6–3.4)
MCH, POC: 28.8 pg (ref 27–31.2)
MCHC: 32.8 g/dL (ref 31.8–35.4)
MCV: 87.7 fL (ref 76–111)
MID (cbc): 0.3 (ref 0–0.9)
MPV: 9.4 fL (ref 0–99.8)
POC Granulocyte: 3.2 (ref 2–6.9)
POC LYMPH PERCENT: 34.3 %L (ref 10–50)
POC MID %: 5.7 %M (ref 0–12)
Platelet Count, POC: 189 10*3/uL (ref 142–424)
RBC: 4.49 M/uL (ref 4.04–5.48)
RDW, POC: 12.9 %
WBC: 5.4 10*3/uL (ref 4.6–10.2)

## 2020-05-15 LAB — POCT URINALYSIS DIP (CLINITEK)
Bilirubin, UA: NEGATIVE
Glucose, UA: NEGATIVE mg/dL
Ketones, POC UA: NEGATIVE mg/dL
Leukocytes, UA: NEGATIVE
Nitrite, UA: NEGATIVE
Spec Grav, UA: 1.025 (ref 1.010–1.025)
Urobilinogen, UA: 0.2 E.U./dL
pH, UA: 7.5 (ref 5.0–8.0)

## 2020-05-15 LAB — POCT URINE PREGNANCY: Preg Test, Ur: NEGATIVE

## 2020-05-15 MED ORDER — TRAMADOL HCL 50 MG PO TABS: 50.0000 mg | ORAL_TABLET | Freq: Three times a day (TID) | ORAL | 0 refills | Status: AC | PRN

## 2020-05-15 NOTE — Patient Instructions (Signed)
° ° ° °  If you have lab work done today you will be contacted with your lab results within the next 2 weeks.  If you have not heard from us then please contact us. The fastest way to get your results is to register for My Chart. ° ° °IF you received an x-ray today, you will receive an invoice from [facility]. Please contact [facility] at [phone] with questions or concerns regarding your invoice.  ° °IF you received labwork today, you will receive an invoice from [facility]. Please contact [facility] at [phone] with questions or concerns regarding your invoice.  ° °Our billing staff will not be able to assist you with questions regarding bills from these companies. ° °You will be contacted with the lab results as soon as they are available. The fastest way to get your results is to activate your My Chart account. Instructions are located on the last page of this paperwork. If you have not heard from us regarding the results in 2 weeks, please contact this office. °  ° ° ° °

## 2020-05-15 NOTE — Progress Notes (Signed)
Acute Office Visit  Subjective:    Patient ID: Tonya Arnold, female    DOB: August 29, 1986, 34 y.o.   MRN: [id number]  Chief Complaint  Patient presents with  . Abdominal Pain    Patient states she has been having abdominal pain starting this morning. She states it calms down then pain increase rapaidly. Per patient she has not taken anything yet.    HPI Patient is in today for acute lower left abdominal / pelvic pain  Onset this morning Located entirely within LLQ / L side of pelvis. No radiation. No bruising. No distension Coming in waves, waves of pain getting more intense.   Does note that she recently switched brands of low dose COCs. Noted that since starting her current pack of pills, she has had two instances of AUB. First was starting days 1 and 2 and lasted for 3-4 days, the second started 3 days ago and is ongoing. She reports having been extremely regular in the past. No adverse GU history  Denies urinary symptoms, GI symptoms, neuro/cognitive symptoms, or other concerns.   No new partners, no systemic symptoms, no discharge, no known STI exposure.   Past Medical History:  Diagnosis Date  . History of chicken pox   . History of fainting spells of unknown cause   . Migraine     Past Surgical History:  Procedure Laterality Date  . CESAREAN SECTION    . CESAREAN SECTION  07/10/2012   Procedure: CESAREAN SECTION;  Surgeon: Mickel Baas, MD;  Location: [facility];  Service: Obstetrics;  Laterality: N/A;  Repeat cesarean section with delivery of baby girl at 1700.  Apgars 8/9.    Family History  Problem Relation Age of Onset  . Asthma Mother   . Diabetes Father   . Hypertension Father   . Kidney disease Father   . Heart disease Father     Social History   Socioeconomic History  . Marital status: Married    Spouse name: Not on file  . Number of children: 2  . Years of education: Not on file  . Highest education level: Not on file  Occupational  History  . Not on file  Tobacco Use  . Smoking status: Never Smoker  . Smokeless tobacco: Never Used  Vaping Use  . Vaping Use: Never used  Substance and Sexual Activity  . Alcohol use: Yes    Comment: Occ  . Drug use: No  . Sexual activity: Yes  Other Topics Concern  . Not on file  Social History Narrative   Married   Geophysical data processor   Social Determinants of Health   Financial Resource Strain:   . Difficulty of Paying Living Expenses: Not on file  Food Insecurity:   . Worried About Programme researcher, broadcasting/film/video in the Last Year: Not on file  . Ran Out of Food in the Last Year: Not on file  Transportation Needs:   . Lack of Transportation (Medical): Not on file  . Lack of Transportation (Non-Medical): Not on file  Physical Activity:   . Days of Exercise per Week: Not on file  . Minutes of Exercise per Session: Not on file  Stress:   . Feeling of Stress : Not on file  Social Connections:   . Frequency of Communication with Friends and Family: Not on file  . Frequency of Social Gatherings with Friends and Family: Not on file  . Attends Religious Services: Not on file  . Active Member  of Clubs or Organizations: Not on file  . Attends BankerClub or Organization Meetings: Not on file  . Marital Status: Not on file  Intimate Partner Violence:   . Fear of Current or Ex-Partner: Not on file  . Emotionally Abused: Not on file  . Physically Abused: Not on file  . Sexually Abused: Not on file    Outpatient Medications Prior to Visit  Medication Sig Dispense Refill  . benzonatate (TESSALON) 100 MG capsule Take 1-2 capsules (100-200 mg total) by mouth 3 (three) times daily as needed. (Patient not taking: Reported on 05/15/2020) 60 capsule 0  . desogestrel-ethinyl estradiol (RECLIPSEN) 0.15-30 MG-MCG tablet Take 1 tablet by mouth daily. (Patient not taking: Reported on 05/15/2020) 1 Package 11  . Ibuprofen-Famotidine (DUEXIS) 800-26.6 MG TABS Take 1 tablet by mouth every 8 (eight) hours as  needed. (Patient not taking: Reported on 05/15/2020) 30 tablet 3  . JUNEL 1/20 1-20 MG-MCG tablet TAKE 1 TABLET BY MOUTH EVERY DAY (Patient not taking: Reported on 10/25/2019) 21 tablet 11  . predniSONE (DELTASONE) 20 MG tablet Take 2 tablets daily with breakfast. (Patient not taking: Reported on 05/15/2020) 10 tablet 0  . promethazine-dextromethorphan (PROMETHAZINE-DM) 6.25-15 MG/5ML syrup Take 5 mLs by mouth at bedtime as needed for cough. (Patient not taking: Reported on 05/15/2020) 100 mL 0   No facility-administered medications prior to visit.    Allergies  Allergen Reactions  . Amoxicillin Rash    Pt states she is not allergic to penicillin or any other antibiotic    Review of Systems  Constitutional: Negative.   HENT: Negative.   Eyes: Negative.   Respiratory: Negative.   Cardiovascular: Negative.   Gastrointestinal: Negative.   Endocrine: Negative.   Genitourinary: Positive for menstrual problem, pelvic pain and vaginal bleeding. Negative for decreased urine volume, difficulty urinating, dyspareunia, dysuria, enuresis, flank pain, frequency, genital sores, hematuria, urgency, vaginal discharge and vaginal pain.  Musculoskeletal: Negative.   Skin: Negative.   Allergic/Immunologic: Negative.   Neurological: Negative.   Hematological: Negative.   Psychiatric/Behavioral: Negative.   All other systems reviewed and are negative.      Objective:    Physical Exam Vitals and nursing note reviewed.  Constitutional:      General: She is not in acute distress.    Appearance: She is well-developed. She is obese. She is not ill-appearing, toxic-appearing or diaphoretic.  Cardiovascular:     Rate and Rhythm: Normal rate and regular rhythm.     Heart sounds: Normal heart sounds.  Pulmonary:     Effort: Pulmonary effort is normal.     Breath sounds: Normal breath sounds.  Abdominal:     General: Bowel sounds are normal.     Palpations: Abdomen is soft. There is no shifting  dullness, fluid wave, hepatomegaly, splenomegaly, mass or pulsatile mass.     Tenderness: There is abdominal tenderness in the left lower quadrant. There is no right CVA tenderness, left CVA tenderness, guarding or rebound. Negative signs include Murphy's sign, Rovsing's sign, McBurney's sign, psoas sign and obturator sign.     Hernia: No hernia is present.  Genitourinary:    Comments: Exam deferred by patient. Skin:    Capillary Refill: Capillary refill takes less than 2 seconds.  Neurological:     General: No focal deficit present.     Mental Status: She is alert and oriented to person, place, and time.     Cranial Nerves: No cranial nerve deficit.     Motor: No weakness.  Psychiatric:  Mood and Affect: Mood is anxious. Mood is not depressed.        Behavior: Behavior normal.     BP 132/84   Pulse 77   Temp 98 F (36.7 C) (Temporal)   Resp 18   Ht 5\' 4"  (1.626 m)   Wt 166 lb (75.3 kg)   SpO2 95%   BMI 28.49 kg/m  Wt Readings from Last 3 Encounters:  05/15/20 166 lb (75.3 kg)  10/25/19 167 lb (75.8 kg)  12/26/18 153 lb 6.4 oz (69.6 kg)    There are no preventive care reminders to display for this patient.  There are no preventive care reminders to display for this patient.   Lab Results  Component Value Date   TSH 2.120 06/13/2018   Lab Results  Component Value Date   WBC 5.4 05/15/2020   HGB 12.9 05/15/2020   HCT 39.4 05/15/2020   MCV 87.7 05/15/2020   PLT 228 06/13/2018   Lab Results  Component Value Date   NA 143 06/13/2018   K 4.2 06/13/2018   CO2 22 06/13/2018   GLUCOSE 83 06/13/2018   BUN 10 06/13/2018   CREATININE 0.81 06/13/2018   BILITOT 0.8 06/13/2018   ALKPHOS 59 06/13/2018   AST 21 06/13/2018   ALT 19 06/13/2018   PROT 6.8 06/13/2018   ALBUMIN 4.5 06/13/2018   CALCIUM 9.3 06/13/2018   Lab Results  Component Value Date   CHOL 190 06/13/2018   Lab Results  Component Value Date   HDL 59 06/13/2018   Lab Results  Component  Value Date   LDLCALC 102 (H) 06/13/2018   Lab Results  Component Value Date   TRIG 145 06/13/2018   Lab Results  Component Value Date   CHOLHDL 3.2 06/13/2018   No results found for: HGBA1C     Assessment & Plan:   Problem List Items Addressed This Visit    None    Visit Diagnoses    Abdominal pain, unspecified abdominal location    -  Primary   Acute abdominal pain       Relevant Medications   traMADol (ULTRAM) 50 MG tablet   Other Relevant Orders   POCT CBC (Completed)   POCT URINALYSIS DIP (CLINITEK) (Completed)   Human Chorionic Gonadotropin (hCG),Quantitative (Serial Monitor)   Comprehensive metabolic panel   Amylase   Lipase   Urine cytology ancillary only   Irregular menses       Relevant Orders   POCT urine pregnancy (Completed)       Meds ordered this encounter  Medications  . traMADol (ULTRAM) 50 MG tablet    Sig: Take 1 tablet (50 mg total) by mouth every 8 (eight) hours as needed for up to 5 days.    Dispense:  15 tablet    Refill:  0    Order Specific Question:   Supervising Provider    Answer:   06/15/2018, JEFFREY R [[id number]]   PLAN  poct urine preg test negative. Will send out beta quant to confirm. Concern for ectopic pregnancy.  POCT ua shows blood, likely from AUB, otherwise no concern. No evidence of infection/UTI.  POCT UA wnl.   Differentials at this time include endometriosis, ovarian cyst, ectopic pregnancy and others. Given wait times in hospitals, will try to get STAT outpatient imaging of the abdomen and pelvis - ideally with ultrasound followed by CT if warranted.  Will send out labs and follow up as warranted  ER precautions reviewed with patient, who  demonstrates understanding  Patient encouraged to call clinic with any questions, comments, or concerns.   Janeece Agee, NP

## 2020-05-16 ENCOUNTER — Other Ambulatory Visit: Payer: Self-pay | Admitting: Registered Nurse

## 2020-05-16 ENCOUNTER — Encounter: Payer: Self-pay | Admitting: Registered Nurse

## 2020-05-16 ENCOUNTER — Other Ambulatory Visit: Payer: Self-pay | Admitting: Emergency Medicine

## 2020-05-16 ENCOUNTER — Telehealth: Payer: Self-pay | Admitting: Emergency Medicine

## 2020-05-16 DIAGNOSIS — R102 Pelvic and perineal pain: Secondary | ICD-10-CM

## 2020-05-16 DIAGNOSIS — R109 Unspecified abdominal pain: Secondary | ICD-10-CM

## 2020-05-16 DIAGNOSIS — M25559 Pain in unspecified hip: Secondary | ICD-10-CM

## 2020-05-16 LAB — LIPASE: Lipase: 34 U/L (ref 14–72)

## 2020-05-16 LAB — COMPREHENSIVE METABOLIC PANEL
ALT: 12 IU/L (ref 0–32)
AST: 18 IU/L (ref 0–40)
Albumin/Globulin Ratio: 1.6 (ref 1.2–2.2)
Albumin: 4.4 g/dL (ref 3.8–4.8)
Alkaline Phosphatase: 68 IU/L (ref 44–121)
BUN/Creatinine Ratio: 11 (ref 9–23)
BUN: 9 mg/dL (ref 6–20)
Bilirubin Total: 0.4 mg/dL (ref 0.0–1.2)
CO2: 22 mmol/L (ref 20–29)
Calcium: 9.2 mg/dL (ref 8.7–10.2)
Chloride: 103 mmol/L (ref 96–106)
Creatinine, Ser: 0.84 mg/dL (ref 0.57–1.00)
GFR calc Af Amer: 104 mL/min/{1.73_m2} (ref >59)
GFR calc non Af Amer: 90 mL/min/{1.73_m2} (ref >59)
Globulin, Total: 2.7 g/dL (ref 1.5–4.5)
Glucose: 92 mg/dL (ref 65–99)
Potassium: 4 mmol/L (ref 3.5–5.2)
Sodium: 138 mmol/L (ref 134–144)
Total Protein: 7.1 g/dL (ref 6.0–8.5)

## 2020-05-16 LAB — HUMAN CHORIONIC GONADOTROPIN(HCG),B-SUBUNIT,QUANTITATIVE): HCG, Beta Chain, Quant, S: <1 m[IU]/mL

## 2020-05-16 LAB — AMYLASE: Amylase: 103 U/L (ref 31–110)

## 2020-05-16 NOTE — Telephone Encounter (Signed)
[facility] called an stated the order is place wrong for the [place] and pelvis.[place] limited is looking for a hernia or mass. (something palpable) [place] limited RUQ is looking at the liver gallbladder with Acute abd pain dx no pevic pain. [place] Radiology Protocol Pelvic write a not if you do not want transvaginal. Just just clarifying want you are looking for. They area waiting on a call back so they can get patient scheduled.

## 2020-05-16 NOTE — Telephone Encounter (Signed)
Are you looking for a mass or organ for the abd [place]

## 2020-05-16 NOTE — Progress Notes (Signed)
Replacing order for pelvic US  Jari Sportsman, NP

## 2020-05-16 NOTE — Telephone Encounter (Signed)
Sorry for the delay - looking for an ovarian cyst. Transvaginal would be fine. Will replace the order with correct dx code.  Thanks,  Jari Sportsman, NP

## 2020-05-16 NOTE — Telephone Encounter (Signed)
Ok they also stated Abd limited only look above the belly button [place] complete looks at most organs? US transvaginal is ok for cyst

## 2020-05-16 NOTE — Telephone Encounter (Signed)
Radiology has been informed that we just need the US pelvic transvaginal. Ordering test code of [id number] with dx R10.2. No abd [place] needed at this time

## 2020-05-16 NOTE — Telephone Encounter (Signed)
Yes that would be fine to do the US transvaginal then. No need for limited abd  Thanks Jari Sportsman, NP

## 2020-05-17 LAB — URINE CYTOLOGY ANCILLARY ONLY
Chlamydia: NEGATIVE
Comment: NEGATIVE
Comment: NEGATIVE
Comment: NORMAL
Neisseria Gonorrhea: NEGATIVE
Trichomonas: NEGATIVE

## 2020-05-20 ENCOUNTER — Other Ambulatory Visit: Payer: Self-pay | Admitting: Registered Nurse

## 2020-05-20 DIAGNOSIS — R102 Pelvic and perineal pain: Secondary | ICD-10-CM

## 2020-05-22 ENCOUNTER — Ambulatory Visit
Admission: RE | Admit: 2020-05-22 | Discharge: 2020-05-22 | Disposition: A | Discharge status: Home / Self Care | Payer: BC Managed Care – PPO | Source: Ambulatory Visit | Attending: Registered Nurse | Admitting: Registered Nurse

## 2020-05-22 DIAGNOSIS — R102 Pelvic and perineal pain: Secondary | ICD-10-CM

## 2020-05-23 ENCOUNTER — Encounter: Payer: Self-pay | Admitting: Registered Nurse

## 2020-05-28 ENCOUNTER — Telehealth: Payer: Self-pay | Admitting: Emergency Medicine

## 2020-05-28 NOTE — Telephone Encounter (Signed)
Please Advise

## 2020-05-28 NOTE — Telephone Encounter (Signed)
Please call pt regarding recent  Imaging  results / Pain has persisted .  Please reach out to patient   [phone]    If not able to reach patient ok to talk to Husband Onalee Hua  [phone]

## 2020-06-04 ENCOUNTER — Other Ambulatory Visit: Payer: Self-pay | Admitting: Registered Nurse

## 2020-06-04 DIAGNOSIS — R102 Pelvic and perineal pain: Secondary | ICD-10-CM

## 2020-06-24 ENCOUNTER — Telehealth: Payer: Self-pay | Admitting: Emergency Medicine

## 2020-06-24 NOTE — Telephone Encounter (Signed)
Patients husband is calling and is upset because pt was seen 05/15/2020 and pt was out for Trans Vaginal ultra sound . Patient is still waiting for someone to call and   go over those results .    Please advise

## 2020-06-24 NOTE — Telephone Encounter (Signed)
Accidentally sent message to PCP Sagardia, pt asking for results to vransvaginal [place] please advise

## 2020-06-24 NOTE — Telephone Encounter (Signed)
Who ordered this ultrasound?

## 2020-06-24 NOTE — Telephone Encounter (Signed)
Pt asking what her transvaginal US showed states she has not heard from anyone

## 2020-08-09 ENCOUNTER — Encounter: Payer: BC Managed Care – PPO | Admitting: Obstetrics & Gynecology

## 2020-09-06 ENCOUNTER — Encounter (HOSPITAL_COMMUNITY): Payer: Self-pay

## 2020-09-06 ENCOUNTER — Other Ambulatory Visit: Payer: Self-pay

## 2020-09-06 ENCOUNTER — Emergency Department (HOSPITAL_COMMUNITY)
Admission: EM | Admit: 2020-09-06 | Discharge: 2020-09-06 | Disposition: A | Discharge status: Home / Self Care | Payer: BC Managed Care – PPO | Attending: Emergency Medicine | Admitting: Emergency Medicine

## 2020-09-06 DIAGNOSIS — M79661 Pain in right lower leg: Secondary | ICD-10-CM | POA: Diagnosis present

## 2020-09-06 DIAGNOSIS — M79662 Pain in left lower leg: Secondary | ICD-10-CM | POA: Diagnosis not present

## 2020-09-06 DIAGNOSIS — M79605 Pain in left leg: Secondary | ICD-10-CM

## 2020-09-06 DIAGNOSIS — M79604 Pain in right leg: Secondary | ICD-10-CM

## 2020-09-06 LAB — CK: Total CK: 146 U/L (ref 38–234)

## 2020-09-06 LAB — COMPREHENSIVE METABOLIC PANEL
ALT: 17 U/L (ref 0–44)
AST: 29 U/L (ref 15–41)
Albumin: 4 g/dL (ref 3.5–5.0)
Alkaline Phosphatase: 49 U/L (ref 38–126)
Anion gap: 10 (ref 5–15)
BUN: 9 mg/dL (ref 6–20)
CO2: 24 mmol/L (ref 22–32)
Calcium: 9.1 mg/dL (ref 8.9–10.3)
Chloride: 105 mmol/L (ref 98–111)
Creatinine, Ser: 0.75 mg/dL (ref 0.44–1.00)
GFR, Estimated: >60 mL/min (ref >60)
Glucose, Bld: 87 mg/dL (ref 70–99)
Potassium: 4.5 mmol/L (ref 3.5–5.1)
Sodium: 139 mmol/L (ref 135–145)
Total Bilirubin: 0.9 mg/dL (ref 0.3–1.2)
Total Protein: 6.9 g/dL (ref 6.5–8.1)

## 2020-09-06 LAB — CBC WITH DIFFERENTIAL/PLATELET
Abs Immature Granulocytes: 0.01 10*3/uL (ref 0.00–0.07)
Basophils Absolute: 0 10*3/uL (ref 0.0–0.1)
Basophils Relative: 0 %
Eosinophils Absolute: 0 10*3/uL (ref 0.0–0.5)
Eosinophils Relative: 1 %
HCT: 42.1 % (ref 36.0–46.0)
Hemoglobin: 13.9 g/dL (ref 12.0–15.0)
Immature Granulocytes: 0 %
Lymphocytes Relative: 34 %
Lymphs Abs: 1 10*3/uL (ref 0.7–4.0)
MCH: 29.3 pg (ref 26.0–34.0)
MCHC: 33 g/dL (ref 30.0–36.0)
MCV: 88.8 fL (ref 80.0–100.0)
Monocytes Absolute: 0.3 10*3/uL (ref 0.1–1.0)
Monocytes Relative: 10 %
Neutro Abs: 1.6 10*3/uL — ABNORMAL LOW (ref 1.7–7.7)
Neutrophils Relative %: 55 %
Platelets: 147 10*3/uL — ABNORMAL LOW (ref 150–400)
RBC: 4.74 MIL/uL (ref 3.87–5.11)
RDW: 12.8 % (ref 11.5–15.5)
WBC: 2.9 10*3/uL — ABNORMAL LOW (ref 4.0–10.5)
nRBC: 0 % (ref 0.0–0.2)

## 2020-09-06 LAB — SEDIMENTATION RATE: Sed Rate: 6 mm/hr (ref 0–22)

## 2020-09-06 MED ORDER — MORPHINE SULFATE (PF) 4 MG/ML IV SOLN
4.0000 mg | Freq: Once | INTRAVENOUS | Status: AC
  Administered 2020-09-06: 4 mg via INTRAVENOUS
  Filled 2020-09-06: qty 1

## 2020-09-06 MED ORDER — METHOCARBAMOL 750 MG PO TABS: 750.0000 mg | ORAL_TABLET | Freq: Four times a day (QID) | ORAL | 0 refills | Status: DC

## 2020-09-06 MED ORDER — HYDROCODONE-ACETAMINOPHEN 5-325 MG PO TABS
2.0000 | ORAL_TABLET | ORAL | 0 refills | Status: DC | PRN
Start: 2020-09-06 — End: 2021-03-14

## 2020-09-06 MED ORDER — SODIUM CHLORIDE 0.9 % IV SOLN: INTRAVENOUS | Status: DC

## 2020-09-06 MED ORDER — METHOCARBAMOL 1000 MG/10ML IJ SOLN
1000.0000 mg | Freq: Once | INTRAVENOUS | Status: AC
  Administered 2020-09-06: 1000 mg via INTRAVENOUS
  Filled 2020-09-06: qty 1000

## 2020-09-06 MED ORDER — KETOROLAC TROMETHAMINE 30 MG/ML IJ SOLN
30.0000 mg | Freq: Once | INTRAMUSCULAR | Status: AC
  Administered 2020-09-06: 30 mg via INTRAVENOUS
  Filled 2020-09-06: qty 1

## 2020-09-06 NOTE — ED Notes (Signed)
Discharge paperwork reviewed with pt, including prescriptions.  Pt verbalized understanding, with no questions or concerns at time of discharge.  Pt ambulatory to ED exit to wait for husband to pick her up.

## 2020-09-06 NOTE — ED Triage Notes (Addendum)
Pt reports severe bilateral leg pain described as a "never ending charlie horse".

## 2020-09-06 NOTE — ED Provider Notes (Signed)
[facility]-EMERGENCY DEPT Provider Note   CSN: [id number] Arrival date & time: 09/06/20  0542     History Chief Complaint  Patient presents with  . Leg Pain    Gwynn Micheale Schlack is a 35 y.o. female.  35 year old female presents with 2 days of bilateral lower extremity discomfort.  Discomfort starts in her thighs and goes down to her calf.  Denies any chest discomfort or shortness of breath.  No trouble with ambulation.  Denies any back discomfort.  No bowel or bladder dysfunction.  Pain is sharp and is worse at night.  No distal numbness or tingling in her feet.  Denies any rashes to her legs.  Symptoms have been persistent.  No prior history of same.  No treatment use prior to arrival        Past Medical History:  Diagnosis Date  . History of chicken pox   . History of fainting spells of unknown cause   . Migraine     There are no problems to display for this patient.   Past Surgical History:  Procedure Laterality Date  . CESAREAN SECTION    . CESAREAN SECTION  07/10/2012   Procedure: CESAREAN SECTION;  Surgeon: Mickel Baas, MD;  Location: [facility];  Service: Obstetrics;  Laterality: N/A;  Repeat cesarean section with delivery of baby girl at 1700.  Apgars 8/9.     OB History    Gravida  2   Para  2   Term  2   Preterm      AB      Living  2     SAB      IAB      Ectopic      Multiple      Live Births  1           Family History  Problem Relation Age of Onset  . Asthma Mother   . Diabetes Father   . Hypertension Father   . Kidney disease Father   . Heart disease Father     Social History   Tobacco Use  . Smoking status: Never Smoker  . Smokeless tobacco: Never Used  Vaping Use  . Vaping Use: Never used  Substance Use Topics  . Alcohol use: Yes    Comment: Occ  . Drug use: No    Home Medications Prior to Admission medications   Medication Sig Start Date End Date Taking? Authorizing Provider   benzonatate (TESSALON) 100 MG capsule Take 1-2 capsules (100-200 mg total) by mouth 3 (three) times daily as needed. Patient not taking: Reported on 05/15/2020 01/03/20   Wallis Bamberg, PA-C  desogestrel-ethinyl estradiol (RECLIPSEN) 0.15-30 MG-MCG tablet Take 1 tablet by mouth daily. Patient not taking: Reported on 05/15/2020 12/26/18   Georgina Quint, MD  Ibuprofen-Famotidine (DUEXIS) 800-26.6 MG TABS Take 1 tablet by mouth every 8 (eight) hours as needed. Patient not taking: Reported on 05/15/2020 12/26/18   Georgina Quint, MD  JUNEL 1/20 1-20 MG-MCG tablet TAKE 1 TABLET BY MOUTH EVERY DAY Patient not taking: Reported on 10/25/2019 12/03/18   Georgina Quint, MD  predniSONE (DELTASONE) 20 MG tablet Take 2 tablets daily with breakfast. Patient not taking: Reported on 05/15/2020 01/03/20   Wallis Bamberg, PA-C  promethazine-dextromethorphan (PROMETHAZINE-DM) 6.25-15 MG/5ML syrup Take 5 mLs by mouth at bedtime as needed for cough. Patient not taking: Reported on 05/15/2020 01/03/20   Wallis Bamberg, PA-C    Allergies    Doxycycline hyclate  and Amoxicillin  Review of Systems   Review of Systems  All other systems reviewed and are negative.   Physical Exam Updated Vital Signs BP 132/84 (BP Location: Left Arm)   Pulse 92   Temp 98.6 F (37 C) (Oral)   Resp 18   SpO2 100%   Physical Exam Vitals and nursing note reviewed.  Constitutional:      General: She is not in acute distress.    Appearance: Normal appearance. She is well-developed and well-nourished. She is not toxic-appearing.  HENT:     Head: Normocephalic and atraumatic.  Eyes:     General: Lids are normal.     Extraocular Movements: EOM normal.     Conjunctiva/sclera: Conjunctivae normal.     Pupils: Pupils are equal, round, and reactive to light.  Neck:     Thyroid: No thyroid mass.     Trachea: No tracheal deviation.  Cardiovascular:     Rate and Rhythm: Normal rate and regular rhythm.     Heart sounds: Normal  heart sounds. No murmur heard. No gallop.   Pulmonary:     Effort: Pulmonary effort is normal. No respiratory distress.     Breath sounds: Normal breath sounds. No stridor. No decreased breath sounds, wheezing, rhonchi or rales.  Abdominal:     General: Bowel sounds are normal. There is no distension.     Palpations: Abdomen is soft.     Tenderness: There is no abdominal tenderness. There is no CVA tenderness or rebound.  Musculoskeletal:        General: No tenderness or edema. Normal range of motion.     Cervical back: Normal range of motion and neck supple.     Comments: No swelling or edema noted to lower extremities.  Neurovascular tact at bilateral feet.  Skin:    General: Skin is warm and dry.     Findings: No abrasion or rash.  Neurological:     Mental Status: She is alert and oriented to person, place, and time.     GCS: GCS eye subscore is 4. GCS verbal subscore is 5. GCS motor subscore is 6.     Cranial Nerves: No cranial nerve deficit.     Sensory: No sensory deficit.     Deep Tendon Reflexes: Strength normal.  Psychiatric:        Mood and Affect: Mood and affect normal.        Speech: Speech normal.        Behavior: Behavior normal.     ED Results / Procedures / Treatments   Labs (all labs ordered are listed, but only abnormal results are displayed) Labs Reviewed - No data to display  EKG None  Radiology No results found.  Procedures Procedures (including critical care time)  Medications Ordered in ED Medications  0.9 %  sodium chloride infusion (has no administration in time range)  methocarbamol (ROBAXIN) 1,000 mg in dextrose 5 % 100 mL IVPB (has no administration in time range)  morphine 4 MG/ML injection 4 mg (has no administration in time range)    ED Course  I have reviewed the triage vital signs and the nursing notes.  Pertinent labs & imaging results that were available during my care of the patient were reviewed by me and considered in my  medical decision making (see chart for details).    MDM Rules/Calculators/A&P  Patient treated medications here for her bilateral leg pain and she feels better.  No suspicion that this could be a DVT.  Laboratory studies here are reassuring including normal sed rate and CK.  Will discharge home and she will follow-up with her primary care doctor  Final Clinical Impression(s) / ED Diagnoses Final diagnoses:  None    Rx / DC Orders ED Discharge Orders    None       Lacretia Leigh, MD 09/06/20 1058

## 2020-09-26 ENCOUNTER — Ambulatory Visit: Payer: BC Managed Care – PPO | Admitting: Emergency Medicine

## 2020-10-01 ENCOUNTER — Other Ambulatory Visit: Payer: Self-pay

## 2020-10-01 ENCOUNTER — Ambulatory Visit (INDEPENDENT_AMBULATORY_CARE_PROVIDER_SITE_OTHER): Payer: BC Managed Care – PPO | Admitting: Emergency Medicine

## 2020-10-01 ENCOUNTER — Encounter: Payer: Self-pay | Admitting: Emergency Medicine

## 2020-10-01 VITALS — BP 122/77 | HR 87 | Temp 97.9°F | Ht 64.0 in | Wt 157.2 lb

## 2020-10-01 DIAGNOSIS — M79604 Pain in right leg: Secondary | ICD-10-CM

## 2020-10-01 DIAGNOSIS — R29898 Other symptoms and signs involving the musculoskeletal system: Secondary | ICD-10-CM | POA: Diagnosis not present

## 2020-10-01 DIAGNOSIS — M79605 Pain in left leg: Secondary | ICD-10-CM | POA: Diagnosis not present

## 2020-10-01 DIAGNOSIS — R233 Spontaneous ecchymoses: Secondary | ICD-10-CM

## 2020-10-01 DIAGNOSIS — R238 Other skin changes: Secondary | ICD-10-CM

## 2020-10-01 NOTE — Progress Notes (Signed)
Tonya Arnold 35 y.o.   Chief Complaint  Patient presents with  . hospital f/u    Patient was last seen at ED on 09/06/20 for both leg pain. Was given meds some helped and some has not    HISTORY OF PRESENT ILLNESS: This is a 35 y.o. female complaining of sharp intermittent pains to both legs that got real bad on 09/06/2020 when she had to go to the emergency room for evaluation and treatment.  She was treated and released.  Blood work looked normal. Patient has a history of easy bruising to extremities, mostly legs for the past several years.  Needs hematology referral. Denies any injuries.  Denies lumbar pain.  Denies bowel or bladder dysfunction. Pains are becoming more more frequent over the past several weeks.  Partially responsive to analgesics.  HPI   Prior to Admission medications   Medication Sig Start Date End Date Taking? Authorizing Provider  ibuprofen (ADVIL) 200 MG tablet Take 440 mg by mouth every 6 (six) hours as needed for fever, headache or mild pain.   Yes [provider]  KARIVA 0.15-0.02/0.01 MG (21/5) tablet Take 1 tablet by mouth daily. 06/15/20  Yes [provider]  Multiple Vitamin (MULTIVITAMIN WITH MINERALS) TABS tablet Take 1 tablet by mouth daily.   Yes [provider]  naproxen sodium (ALEVE) 220 MG tablet Take 440 mg by mouth 2 (two) times daily as needed (headache/pain).   Yes [provider]  VITAMIN D PO Take 1 capsule by mouth daily.   Yes [provider]  HYDROcodone-acetaminophen (NORCO/VICODIN) 5-325 MG tablet Take 2 tablets by mouth every 4 (four) hours as needed. Patient not taking: Reported on 10/01/2020 09/06/20   Lorre NickAllen, Anthony, MD  methocarbamol (ROBAXIN-750) 750 MG tablet Take 1 tablet (750 mg total) by mouth 4 (four) times daily. Patient not taking: Reported on 10/01/2020 09/06/20   Lorre NickAllen, Anthony, MD    Allergies  Allergen Reactions  . Doxycycline Hyclate Other (See Comments)  . Amoxicillin  Rash and Other (See Comments)    Pt states she is not allergic to penicillin or any other antibiotic    There are no problems to display for this patient.   Past Medical History:  Diagnosis Date  . History of chicken pox   . History of fainting spells of unknown cause   . Migraine     Past Surgical History:  Procedure Laterality Date  . CESAREAN SECTION    . CESAREAN SECTION  07/10/2012   Procedure: CESAREAN SECTION;  Surgeon: Mickel Baasichard D Kaplan, MD;  Location: [facility];  Service: Obstetrics;  Laterality: N/A;  Repeat cesarean section with delivery of baby girl at 1700.  Apgars 8/9.    Social History   Socioeconomic History  . Marital status: Married    Spouse name: Not on file  . Number of children: 2  . Years of education: Not on file  . Highest education level: Not on file  Occupational History  . Not on file  Tobacco Use  . Smoking status: Never Smoker  . Smokeless tobacco: Never Used  Vaping Use  . Vaping Use: Never used  Substance and Sexual Activity  . Alcohol use: Yes    Comment: Occ  . Drug use: No  . Sexual activity: Yes  Other Topics Concern  . Not on file  Social History Narrative   Married   Geophysical data processorHuman Resources Manager   Social Determinants of Health   Financial Resource Strain: Not on file  Food Insecurity: Not on file  Transportation Needs: Not on file  Physical Activity: Not on file  Stress: Not on file  Social Connections: Not on file  Intimate Partner Violence: Not on file    Family History  Problem Relation Age of Onset  . Asthma Mother   . Diabetes Father   . Hypertension Father   . Kidney disease Father   . Heart disease Father      Review of Systems  Constitutional: Negative.  Negative for chills and fever.  HENT: Negative.  Negative for congestion and sore throat.   Respiratory: Negative.  Negative for cough.   Cardiovascular: Negative.  Negative for chest pain and palpitations.  Gastrointestinal: Negative.  Negative for  abdominal pain, diarrhea, nausea and vomiting.  Genitourinary: Negative.  Negative for dysuria and hematuria.  Skin: Negative.  Negative for rash.  Neurological: Negative.  Negative for dizziness and headaches.  All other systems reviewed and are negative.    Today's Vitals   10/01/20 1324  BP: 122/77  Pulse: 87  Temp: 97.9 F (36.6 C)  TempSrc: Temporal  SpO2: 100%  Weight: 157 lb 3.2 oz (71.3 kg)  Height: 5\' 4"  (1.626 m)   Body mass index is 26.98 kg/m.   Physical Exam Vitals reviewed.  Constitutional:      Appearance: Normal appearance.  HENT:     Head: Normocephalic.     Mouth/Throat:     Mouth: Mucous membranes are moist.     Pharynx: Oropharynx is clear.  Eyes:     Extraocular Movements: Extraocular movements intact.     Conjunctiva/sclera: Conjunctivae normal.     Pupils: Pupils are equal, round, and reactive to light.  Cardiovascular:     Rate and Rhythm: Normal rate and regular rhythm.     Pulses: Normal pulses.     Heart sounds: Normal heart sounds.  Pulmonary:     Effort: Pulmonary effort is normal.     Breath sounds: Normal breath sounds.  Abdominal:     General: There is no distension.     Palpations: Abdomen is soft.     Tenderness: There is no abdominal tenderness.  Musculoskeletal:     Cervical back: Normal range of motion and neck supple.  Skin:    General: Skin is warm and dry.     Capillary Refill: Capillary refill takes less than 2 seconds.  Neurological:     Mental Status: She is alert and oriented to person, place, and time.     Sensory: No sensory deficit.     Gait: Gait normal.     Deep Tendon Reflexes: Reflexes normal.     Comments: Mild weakness on left leg  Psychiatric:        Mood and Affect: Mood normal.        Behavior: Behavior normal.      ASSESSMENT & PLAN: Tonya Arnold was seen today for hospital f/u.  Diagnoses and all orders for this visit:  Bilateral leg pain -     Ambulatory referral to Orthopedic Surgery -      Ambulatory referral to Neurology  Easy bruising -     Ambulatory referral to Hematology  Left leg weakness -     Ambulatory referral to Neurology    Patient Instructions       If you have lab work done today you will be contacted with your lab results within the next 2 weeks.  If you have not heard from [place] then please contact us. The  fastest way to get your results is to register for My Chart.   IF you received an x-ray today, you will receive an invoice from [facility]. Please contact [facility] at [phone] with questions or concerns regarding your invoice.   IF you received labwork today, you will receive an invoice from [facility]. Please contact [facility] at [phone] with questions or concerns regarding your invoice.   Our billing staff will not be able to assist you with questions regarding bills from these companies.  You will be contacted with the lab results as soon as they are available. The fastest way to get your results is to activate your My Chart account. Instructions are located on the last page of this paperwork. If you have not heard from [place] regarding the results in 2 weeks, please contact this office.      Health Maintenance, Female Adopting a healthy lifestyle and getting preventive care are important in promoting health and wellness. Ask your health care provider about:  The right schedule for you to have regular tests and exams.  Things you can do on your own to prevent diseases and keep yourself healthy. What should I know about diet, weight, and exercise? Eat a healthy diet  Eat a diet that includes plenty of vegetables, fruits, low-fat dairy products, and lean protein.  Do not eat a lot of foods that are high in solid fats, added sugars, or sodium.   Maintain a healthy weight Body mass index (BMI) is used to identify weight problems. It estimates body fat based on height and weight. Your health care provider can help  determine your BMI and help you achieve or maintain a healthy weight. Get regular exercise Get regular exercise. This is one of the most important things you can do for your health. Most adults should:  Exercise for at least 150 minutes each week. The exercise should increase your heart rate and make you sweat (moderate-intensity exercise).  Do strengthening exercises at least twice a week. This is in addition to the moderate-intensity exercise.  Spend less time sitting. Even light physical activity can be beneficial. Watch cholesterol and blood lipids Have your blood tested for lipids and cholesterol at 36 years of age, then have this test every 5 years. Have your cholesterol levels checked more often if:  Your lipid or cholesterol levels are high.  You are older than 36 years of age.  You are at high risk for heart disease. What should I know about cancer screening? Depending on your health history and family history, you may need to have cancer screening at various ages. This may include screening for:  Breast cancer.  Cervical cancer.  Colorectal cancer.  Skin cancer.  Lung cancer. What should I know about heart disease, diabetes, and high blood pressure? Blood pressure and heart disease  High blood pressure causes heart disease and increases the risk of stroke. This is more likely to develop in people who have high blood pressure readings, are of African descent, or are overweight.  Have your blood pressure checked: ? Every 3-5 years if you are 50-3 years of age. ? Every year if you are 46 years old or older. Diabetes Have regular diabetes screenings. This checks your fasting blood sugar level. Have the screening done:  Once every three years after age 87 if you are at a normal weight and have a low risk for diabetes.  More often and at a younger age if you are overweight or have a high  risk for diabetes. What should I know about preventing infection? Hepatitis  B If you have a higher risk for hepatitis B, you should be screened for this virus. Talk with your health care provider to find out if you are at risk for hepatitis B infection. Hepatitis C Testing is recommended for:  Everyone born from [birth year] through 1966.  Anyone with known risk factors for hepatitis C. Sexually transmitted infections (STIs)  Get screened for STIs, including gonorrhea and chlamydia, if: ? You are sexually active and are younger than 36 years of age. ? You are older than 36 years of age and your health care provider tells you that you are at risk for this type of infection. ? Your sexual activity has changed since you were last screened, and you are at increased risk for chlamydia or gonorrhea. Ask your health care provider if you are at risk.  Ask your health care provider about whether you are at high risk for HIV. Your health care provider may recommend a prescription medicine to help prevent HIV infection. If you choose to take medicine to prevent HIV, you should first get tested for HIV. You should then be tested every 3 months for as long as you are taking the medicine. Pregnancy  If you are about to stop having your period (premenopausal) and you may become pregnant, seek counseling before you get pregnant.  Take 400 to 800 micrograms (mcg) of folic acid every day if you become pregnant.  Ask for birth control (contraception) if you want to prevent pregnancy. Osteoporosis and menopause Osteoporosis is a disease in which the bones lose minerals and strength with aging. This can result in bone fractures. If you are 65 years old or older, or if you are at risk for osteoporosis and fractures, ask your health care provider if you should:  Be screened for bone loss.  Take a calcium or vitamin D supplement to lower your risk of fractures.  Be given hormone replacement therapy (HRT) to treat symptoms of menopause. Follow these instructions at home: Lifestyle  Do not  use any products that contain nicotine or tobacco, such as cigarettes, e-cigarettes, and chewing tobacco. If you need help quitting, ask your health care provider.  Do not use street drugs.  Do not share needles.  Ask your health care provider for help if you need support or information about quitting drugs. Alcohol use  Do not drink alcohol if: ? Your health care provider tells you not to drink. ? You are pregnant, may be pregnant, or are planning to become pregnant.  If you drink alcohol: ? Limit how much you use to 0-1 drink a day. ? Limit intake if you are breastfeeding.  Be aware of how much alcohol is in your drink. In the [place] one drink equals one 12 oz bottle of beer (355 mL), one 5 oz glass of wine (148 mL), or one 1 oz glass of hard liquor (44 mL). General instructions  Schedule regular health, dental, and eye exams.  Stay current with your vaccines.  Tell your health care provider if: ? You often feel depressed. ? You have ever been abused or do not feel safe at home. Summary  Adopting a healthy lifestyle and getting preventive care are important in promoting health and wellness.  Follow your health care provider's instructions about healthy diet, exercising, and getting tested or screened for diseases.  Follow your health care provider's instructions on monitoring your cholesterol and blood pressure. This information is  not intended to replace advice given to you by your health care provider. Make sure you discuss any questions you have with your health care provider. Document Revised: 08/10/2018 Document Reviewed: 08/10/2018 Elsevier Patient Education  2021 [employer]vier Inc.      Edwina Barth, MD [facility]

## 2020-10-01 NOTE — Patient Instructions (Addendum)
   If you have lab work done today you will be contacted with your lab results within the next 2 weeks.  If you have not heard from us then please contact us. The fastest way to get your results is to register for My Chart.   IF you received an x-ray today, you will receive an invoice from [facility]. Please contact [facility] at [phone] with questions or concerns regarding your invoice.   IF you received labwork today, you will receive an invoice from [facility]. Please contact [facility] at [phone] with questions or concerns regarding your invoice.   Our billing staff will not be able to assist you with questions regarding bills from these companies.  You will be contacted with the lab results as soon as they are available. The fastest way to get your results is to activate your My Chart account. Instructions are located on the last page of this paperwork. If you have not heard from us regarding the results in 2 weeks, please contact this office.     Health Maintenance, Female Adopting a healthy lifestyle and getting preventive care are important in promoting health and wellness. Ask your health care provider about:  The right schedule for you to have regular tests and exams.  Things you can do on your own to prevent diseases and keep yourself healthy. What should I know about diet, weight, and exercise? Eat a healthy diet  Eat a diet that includes plenty of vegetables, fruits, low-fat dairy products, and lean protein.  Do not eat a lot of foods that are high in solid fats, added sugars, or sodium.   Maintain a healthy weight Body mass index (BMI) is used to identify weight problems. It estimates body fat based on height and weight. Your health care provider can help determine your BMI and help you achieve or maintain a healthy weight. Get regular exercise Get regular exercise. This is one of the most important things you can do for your health. Most  adults should:  Exercise for at least 150 minutes each week. The exercise should increase your heart rate and make you sweat (moderate-intensity exercise).  Do strengthening exercises at least twice a week. This is in addition to the moderate-intensity exercise.  Spend less time sitting. Even light physical activity can be beneficial. Watch cholesterol and blood lipids Have your blood tested for lipids and cholesterol at 36 years of age, then have this test every 5 years. Have your cholesterol levels checked more often if:  Your lipid or cholesterol levels are high.  You are older than 36 years of age.  You are at high risk for heart disease. What should I know about cancer screening? Depending on your health history and family history, you may need to have cancer screening at various ages. This may include screening for:  Breast cancer.  Cervical cancer.  Colorectal cancer.  Skin cancer.  Lung cancer. What should I know about heart disease, diabetes, and high blood pressure? Blood pressure and heart disease  High blood pressure causes heart disease and increases the risk of stroke. This is more likely to develop in people who have high blood pressure readings, are of African descent, or are overweight.  Have your blood pressure checked: ? Every 3-5 years if you are 18-39 years of age. ? Every year if you are 40 years old or older. Diabetes Have regular diabetes screenings. This checks your fasting blood sugar level. Have the screening done:  Once every   three years after age 40 if you are at a normal weight and have a low risk for diabetes.  More often and at a younger age if you are overweight or have a high risk for diabetes. What should I know about preventing infection? Hepatitis B If you have a higher risk for hepatitis B, you should be screened for this virus. Talk with your health care provider to find out if you are at risk for hepatitis B infection. Hepatitis  C Testing is recommended for:  Everyone born from 1945 through 1965.  Anyone with known risk factors for hepatitis C. Sexually transmitted infections (STIs)  Get screened for STIs, including gonorrhea and chlamydia, if: ? You are sexually active and are younger than 36 years of age. ? You are older than 36 years of age and your health care provider tells you that you are at risk for this type of infection. ? Your sexual activity has changed since you were last screened, and you are at increased risk for chlamydia or gonorrhea. Ask your health care provider if you are at risk.  Ask your health care provider about whether you are at high risk for HIV. Your health care provider may recommend a prescription medicine to help prevent HIV infection. If you choose to take medicine to prevent HIV, you should first get tested for HIV. You should then be tested every 3 months for as long as you are taking the medicine. Pregnancy  If you are about to stop having your period (premenopausal) and you may become pregnant, seek counseling before you get pregnant.  Take 400 to 800 micrograms (mcg) of folic acid every day if you become pregnant.  Ask for birth control (contraception) if you want to prevent pregnancy. Osteoporosis and menopause Osteoporosis is a disease in which the bones lose minerals and strength with aging. This can result in bone fractures. If you are 65 years old or older, or if you are at risk for osteoporosis and fractures, ask your health care provider if you should:  Be screened for bone loss.  Take a calcium or vitamin D supplement to lower your risk of fractures.  Be given hormone replacement therapy (HRT) to treat symptoms of menopause. Follow these instructions at home: Lifestyle  Do not use any products that contain nicotine or tobacco, such as cigarettes, e-cigarettes, and chewing tobacco. If you need help quitting, ask your health care provider.  Do not use street  drugs.  Do not share needles.  Ask your health care provider for help if you need support or information about quitting drugs. Alcohol use  Do not drink alcohol if: ? Your health care provider tells you not to drink. ? You are pregnant, may be pregnant, or are planning to become pregnant.  If you drink alcohol: ? Limit how much you use to 0-1 drink a day. ? Limit intake if you are breastfeeding.  Be aware of how much alcohol is in your drink. In the [place] one drink equals one 12 oz bottle of beer (355 mL), one 5 oz glass of wine (148 mL), or one 1 oz glass of hard liquor (44 mL). General instructions  Schedule regular health, dental, and eye exams.  Stay current with your vaccines.  Tell your health care provider if: ? You often feel depressed. ? You have ever been abused or do not feel safe at home. Summary  Adopting a healthy lifestyle and getting preventive care are important in promoting health and wellness.    Follow your health care provider's instructions about healthy diet, exercising, and getting tested or screened for diseases.  Follow your health care provider's instructions on monitoring your cholesterol and blood pressure. This information is not intended to replace advice given to you by your health care provider. Make sure you discuss any questions you have with your health care provider. Document Revised: 08/10/2018 Document Reviewed: 08/10/2018 Elsevier Patient Education  2021 Elsevier Inc.  

## 2020-10-15 ENCOUNTER — Telehealth: Payer: Self-pay | Admitting: Hematology and Oncology

## 2020-10-15 NOTE — Telephone Encounter (Signed)
Received a new hem referral from Dr. Alvy Bimler for easy bruising. An appt has been scheduled for Tonya Arnold to see Dr. Leonides Schanz on 3/2 at 9am. Appt date and time has been given to the pt's husband. Aware that she should arrive 20 minutes early.

## 2020-10-30 ENCOUNTER — Other Ambulatory Visit: Payer: Self-pay

## 2020-10-30 ENCOUNTER — Inpatient Hospital Stay: Payer: BC Managed Care – PPO | Attending: Hematology and Oncology | Admitting: Hematology and Oncology

## 2020-10-30 ENCOUNTER — Inpatient Hospital Stay: Payer: BC Managed Care – PPO

## 2020-10-30 VITALS — BP 125/73 | HR 77 | Temp 97.6°F | Resp 18 | Ht 64.0 in | Wt 157.1 lb

## 2020-10-30 DIAGNOSIS — Z8249 Family history of ischemic heart disease and other diseases of the circulatory system: Secondary | ICD-10-CM | POA: Diagnosis not present

## 2020-10-30 DIAGNOSIS — R238 Other skin changes: Secondary | ICD-10-CM

## 2020-10-30 DIAGNOSIS — M79604 Pain in right leg: Secondary | ICD-10-CM | POA: Diagnosis not present

## 2020-10-30 DIAGNOSIS — R233 Spontaneous ecchymoses: Secondary | ICD-10-CM | POA: Insufficient documentation

## 2020-10-30 DIAGNOSIS — M79605 Pain in left leg: Secondary | ICD-10-CM | POA: Insufficient documentation

## 2020-10-30 DIAGNOSIS — Z833 Family history of diabetes mellitus: Secondary | ICD-10-CM | POA: Diagnosis not present

## 2020-10-30 LAB — CBC WITH DIFFERENTIAL (CANCER CENTER ONLY)
Abs Immature Granulocytes: 0.02 10*3/uL (ref 0.00–0.07)
Basophils Absolute: 0 10*3/uL (ref 0.0–0.1)
Basophils Relative: 0 %
Eosinophils Absolute: 0.1 10*3/uL (ref 0.0–0.5)
Eosinophils Relative: 1 %
HCT: 40.9 % (ref 36.0–46.0)
Hemoglobin: 13.3 g/dL (ref 12.0–15.0)
Immature Granulocytes: 0 %
Lymphocytes Relative: 15 %
Lymphs Abs: 1.3 10*3/uL (ref 0.7–4.0)
MCH: 28.5 pg (ref 26.0–34.0)
MCHC: 32.5 g/dL (ref 30.0–36.0)
MCV: 87.6 fL (ref 80.0–100.0)
Monocytes Absolute: 0.4 10*3/uL (ref 0.1–1.0)
Monocytes Relative: 5 %
Neutro Abs: 6.4 10*3/uL (ref 1.7–7.7)
Neutrophils Relative %: 79 %
Platelet Count: 231 10*3/uL (ref 150–400)
RBC: 4.67 MIL/uL (ref 3.87–5.11)
RDW: 13.4 % (ref 11.5–15.5)
WBC Count: 8.2 10*3/uL (ref 4.0–10.5)
nRBC: 0 % (ref 0.0–0.2)

## 2020-10-30 LAB — CMP (CANCER CENTER ONLY)
ALT: 12 U/L (ref 0–44)
AST: 15 U/L (ref 15–41)
Albumin: 4.3 g/dL (ref 3.5–5.0)
Alkaline Phosphatase: 48 U/L (ref 38–126)
Anion gap: 8 (ref 5–15)
BUN: 14 mg/dL (ref 6–20)
CO2: 23 mmol/L (ref 22–32)
Calcium: 9.1 mg/dL (ref 8.9–10.3)
Chloride: 107 mmol/L (ref 98–111)
Creatinine: 0.79 mg/dL (ref 0.44–1.00)
GFR, Estimated: >60 mL/min (ref >60)
Glucose, Bld: 91 mg/dL (ref 70–99)
Potassium: 3.9 mmol/L (ref 3.5–5.1)
Sodium: 138 mmol/L (ref 135–145)
Total Bilirubin: 0.7 mg/dL (ref 0.3–1.2)
Total Protein: 7.7 g/dL (ref 6.5–8.1)

## 2020-10-30 LAB — PROTIME-INR
INR: 1 (ref 0.8–1.2)
Prothrombin Time: 12.7 seconds (ref 11.4–15.2)

## 2020-10-30 LAB — APTT: aPTT: 28 seconds (ref 24–36)

## 2020-10-30 NOTE — Progress Notes (Signed)
[facility] Telephone:([phone]) [phone]   Fax:([phone]) [phone]  INITIAL CONSULT NOTE  Patient Care Team: Georgina Quint, MD as PCP - General (Internal Medicine) Gean Birchwood, MD as Consulting Physician (Orthopedic Surgery)  CHIEF COMPLAINTS/PURPOSE OF CONSULTATION:  "Easy Bruising "  HISTORY OF PRESENTING ILLNESS:  Tonya Arnold 35 y.o. female without any significant medical history presents to the clinic for evaluation for easy bruising. She is unaccompanied for this visit. She reports that she noticed bruising over the past five years. She generally notices the bruising when she wakes up in the morning. The bruising is usually located on her thighs but she had one instance that she noticed bruising on her arm. The bruising is generally the size of a dollar coin but she admits she has noticed larger areas in the past. Occasionally, there is pain that is associated with the bruising. Patient denies any trauma associated with the bruising including hyperactive movements while she is asleep.   On 09/06/20, patient presented to the ED due to acute, 10/10 bilateral leg pain. She was treated with IV Robaxin and Morphine with improvement of pain. She reports the bilateral leg pain is still present intermittent but the pain is not as severe. She denies any gait abnormalities but does report numbness. She currently takes oral pain medication as needed for the pain and is scheduled for a consultation with neurology for further workup.   Patient denies any fatigue and is able to complete her ADLs on her own. She reports intential weight loss since incorporating exercise and a healthier diet.  She denies any nausea, vomiting or abdominal pain. Her bowel movements are regular without any hematochezia or melena. She has monthly menstrual bleeding lasting 4-5 days. The second day of her menstrual cycle is when she has the heaviest bleeding which requires her to change a pad every  1.5-2 hours. She denies any other signs of bleeding including nosebleeds, gum bleeding and hematuria. She denies any fevers, chills, shortness of breath, chest pain or cough. She has no other complaints.   MEDICAL HISTORY:  Past Medical History:  Diagnosis Date  . History of chicken pox   . History of fainting spells of unknown cause   . Migraine     SURGICAL HISTORY: Past Surgical History:  Procedure Laterality Date  . CESAREAN SECTION    . CESAREAN SECTION  07/10/2012   Procedure: CESAREAN SECTION;  Surgeon: Mickel Baas, MD;  Location: [facility];  Service: Obstetrics;  Laterality: N/A;  Repeat cesarean section with delivery of baby girl at 1700.  Apgars 8/9.    SOCIAL HISTORY: Social History   Socioeconomic History  . Marital status: Married    Spouse name: Not on file  . Number of children: 2  . Years of education: Not on file  . Highest education level: Not on file  Occupational History  . Not on file  Tobacco Use  . Smoking status: Never Smoker  . Smokeless tobacco: Never Used  Vaping Use  . Vaping Use: Never used  Substance and Sexual Activity  . Alcohol use: Yes    Comment: Occ  . Drug use: No  . Sexual activity: Yes  Other Topics Concern  . Not on file  Social History Narrative   Married   Geophysical data processor   Social Determinants of Health   Financial Resource Strain: Not on file  Food Insecurity: Not on file  Transportation Needs: Not on file  Physical Activity: Not on  file  Stress: Not on file  Social Connections: Not on file  Intimate Partner Violence: Not on file    FAMILY HISTORY: Family History  Problem Relation Age of Onset  . Asthma Mother   . Diabetes Father   . Hypertension Father   . Kidney disease Father   . Heart disease Father     ALLERGIES:  is allergic to doxycycline hyclate and amoxicillin.  MEDICATIONS:  Current Outpatient Medications  Medication Sig Dispense Refill  . HYDROcodone-acetaminophen (NORCO/VICODIN)  5-325 MG tablet Take 2 tablets by mouth every 4 (four) hours as needed. (Patient not taking: Reported on 10/01/2020) 10 tablet 0  . ibuprofen (ADVIL) 200 MG tablet Take 440 mg by mouth every 6 (six) hours as needed for fever, headache or mild pain.    Marland Kitchen KARIVA 0.15-0.02/0.01 MG (21/5) tablet Take 1 tablet by mouth daily.    . methocarbamol (ROBAXIN-750) 750 MG tablet Take 1 tablet (750 mg total) by mouth 4 (four) times daily. (Patient not taking: Reported on 10/01/2020) 30 tablet 0  . Multiple Vitamin (MULTIVITAMIN WITH MINERALS) TABS tablet Take 1 tablet by mouth daily.    . naproxen sodium (ALEVE) 220 MG tablet Take 440 mg by mouth 2 (two) times daily as needed (headache/pain).    Marland Kitchen VITAMIN D PO Take 1 capsule by mouth daily.     No current facility-administered medications for this visit.    REVIEW OF SYSTEMS:   Constitutional: ( - ) fevers, ( - )  chills , ( - ) night sweats Eyes: ( - ) blurriness of vision, ( - ) double vision, ( - ) watery eyes Ears, nose, mouth, throat, and face: ( - ) mucositis, ( - ) sore throat Respiratory: ( - ) cough, ( - ) dyspnea, ( - ) wheezes Cardiovascular: ( - ) palpitation, ( - ) chest discomfort, ( - ) lower extremity swelling Gastrointestinal:  ( - ) nausea, ( - ) heartburn, ( - ) change in bowel habits Skin: ( - ) abnormal skin rashes,  Lymphatics: ( - ) new lymphadenopathy, (+ ) easy bruising Neurological: (+ ) numbness in bilateral lower extremities, ( - ) tingling, ( - ) new weaknesses Behavioral/Psych: ( - ) mood change, ( - ) new changes  All other systems were reviewed with the patient and are negative.  PHYSICAL EXAMINATION: ECOG PERFORMANCE STATUS: 0 - Asymptomatic  Vitals:   10/30/20 0922  BP: 125/73  Pulse: 77  Resp: 18  Temp: 97.6 F (36.4 C)  SpO2: 100%   Filed Weights   10/30/20 0922  Weight: 157 lb 1.6 oz (71.3 kg)    GENERAL: well appearing and in NAD  SKIN: skin color, texture, turgor are normal, no rashes or significant  lesions. No evidence of bruising.  EYES: conjunctiva are pink and non-injected, sclera clear OROPHARYNX: no exudate, no erythema; lips, buccal mucosa, and tongue normal  NECK: supple, non-tender LYMPH:  no palpable lymphadenopathy in the cervical, axillary or supraclavicular lymph nodes.  LUNGS: clear to auscultation and percussion with normal breathing effort HEART: regular rate & rhythm and no murmurs and no lower extremity edema ABDOMEN: soft, non-tender, non-distended, normal bowel sounds Musculoskeletal: no cyanosis of digits and no clubbing.  PSYCH: alert & oriented x 3, fluent speech NEURO: no focal motor/sensory deficits  LABORATORY DATA:  I have reviewed the data as listed CBC Latest Ref Rng & Units 10/30/2020 09/06/2020 05/15/2020  WBC 4.0 - 10.5 K/uL 8.2 2.9(L) 5.4  Hemoglobin 12.0 - 15.0  g/dL 82.5 05.3 97.6  Hematocrit 36.0 - 46.0 % 40.9 42.1 39.4  Platelets 150 - 400 K/uL 231 147(L) -    CMP Latest Ref Rng & Units 10/30/2020 09/06/2020 05/15/2020  Glucose 70 - 99 mg/dL 91 87 92  BUN 6 - 20 mg/dL 14 9 9   Creatinine 0.44 - 1.00 mg/dL 7.34 1.93  Sodium 135 - 145 mmol/L 138 139 138  Potassium 3.5 - 5.1 mmol/L 3.9 4.5 4.0  Chloride 98 - 111 mmol/L 107 105 103  CO2 22 - 32 mmol/L 23 24 22   Calcium 8.9 - 10.3 mg/dL 9.1 9.1 9.2  Total Protein 6.5 - 8.1 g/dL 7.7 6.9 7.1  Total Bilirubin 0.3 - 1.2 mg/dL 0.7 0.9 0.4  Alkaline Phos 38 - 126 U/L 48 49 68  AST 15 - 41 U/L 15 29 18   ALT 0 - 44 U/L 12 17 12     ASSESSMENT & PLAN Avarose Lular Letson is a 35 y.o. female without any significant medical history who presents to the clinic for evaluation for easy bruising.  #Easy bruising: --Reassured patient that it is unlikely patient has any bleeding disorders but will proceed with workup which includes lab works to check CBC, CMP, PT/INR, PTT and vitamin C levels. Plan to follow up with patient with results by phone.   #Bilateral lower extremity pain: --Evaluated in the ED on  09/06/20. Pain improved with IV Robaxin and IV morphine. Plan to have a consultation with neurology to further evaluate. Explained that the pain is unlikely related to the bruising.   Orders Placed This Encounter  Procedures  . CBC with Differential (Cancer Center Only)    Standing Status:   Future    Number of Occurrences:   1    Standing Expiration Date:   10/30/2021  . CMP (Cancer Center only)    Standing Status:   Future    Number of Occurrences:   1    Standing Expiration Date:   10/30/2021  . Protime-INR    Standing Status:   Future    Number of Occurrences:   1    Standing Expiration Date:   10/30/2021  . APTT    Standing Status:   Future    Number of Occurrences:   1    Standing Expiration Date:   10/30/2021  . Vitamin C    Standing Status:   Future    Number of Occurrences:   1    Standing Expiration Date:   10/30/2021    All questions were answered. The patient knows to call the clinic with any problems, questions or concerns.  A total of more than 60 minutes were spent on this encounter and over half of that time was spent on counseling and coordination of care as outlined above.   12/30/2021, MD Department of Hematology/Oncology [facility] Phone: [phone] Pager: [phone] Email: [email or web address]   10/31/2020 8:52 PM

## 2020-10-31 ENCOUNTER — Encounter: Payer: Self-pay | Admitting: Hematology and Oncology

## 2020-11-05 LAB — VITAMIN C: Vitamin C: 1 mg/dL (ref 0.4–2.0)

## 2020-11-06 ENCOUNTER — Telehealth: Payer: Self-pay | Admitting: Physician Assistant

## 2020-11-06 NOTE — Telephone Encounter (Signed)
I tried to reach patient by phone twice with no answer. I called the home phone number and her husband picked up the phone. He stated the patient would be unavailable due to work until 6pm most days. He inquired about the blood results and he saw through mychart with the patient. I explained that the blood results were normal and did not indicate a hematologic etiology for the bruising. I advised to give the clinic a call back if patient develops new or worsening symptoms. No f/u is required.  Patient's husband expressed understanding and satisfaction with the plan provided.

## 2020-11-12 ENCOUNTER — Institutional Professional Consult (permissible substitution): Payer: BC Managed Care – PPO | Admitting: Neurology

## 2021-01-29 ENCOUNTER — Other Ambulatory Visit: Payer: Self-pay

## 2021-01-29 ENCOUNTER — Institutional Professional Consult (permissible substitution): Payer: BC Managed Care – PPO | Admitting: Neurology

## 2021-03-14 ENCOUNTER — Telehealth: Payer: BC Managed Care – PPO | Admitting: Physician Assistant

## 2021-03-14 ENCOUNTER — Encounter: Payer: Self-pay | Admitting: Physician Assistant

## 2021-03-14 DIAGNOSIS — J069 Acute upper respiratory infection, unspecified: Secondary | ICD-10-CM | POA: Diagnosis not present

## 2021-03-14 MED ORDER — PROMETHAZINE-DM 6.25-15 MG/5ML PO SYRP
5.0000 mL | ORAL_SOLUTION | Freq: Every evening | ORAL | 0 refills | Status: DC | PRN
Start: 2021-03-14 — End: 2021-06-17

## 2021-03-14 MED ORDER — PHENYLEPHRINE-GUAIFENESIN 10-400 MG PO TABS
1.0000 | ORAL_TABLET | Freq: Three times a day (TID) | ORAL | 0 refills | Status: DC | PRN
Start: 2021-03-14 — End: 2021-06-17

## 2021-03-14 MED ORDER — ALBUTEROL SULFATE HFA 108 (90 BASE) MCG/ACT IN AERS: 2.0000 | INHALATION_SPRAY | Freq: Four times a day (QID) | RESPIRATORY_TRACT | 0 refills | Status: DC | PRN

## 2021-03-14 MED ORDER — BENZONATATE 100 MG PO CAPS: 100.0000 mg | ORAL_CAPSULE | Freq: Three times a day (TID) | ORAL | 0 refills | Status: DC | PRN

## 2021-03-14 NOTE — Patient Instructions (Signed)
Upper Respiratory Infection, Adult  An upper respiratory infection (URI) affects the nose, throat, and upper air passages. URIs are caused by germs (viruses). The most common type of URI is often called "the common cold."  Medicines cannot cure URIs, but you can do things at home to relieve your symptoms. URIs usually get better within 7-10 days.  Follow these instructions at home:  Activity  Rest as needed.  If you have a fever, stay home from work or school until your fever is gone, or until your doctor says you may return to work or school.  You should stay home until you cannot spread the infection anymore (you are not contagious).  Your doctor may have you wear a face mask so you have less risk of spreading the infection.  Relieving symptoms  Gargle with a salt-water mixture 3-4 times a day or as needed. To make a salt-water mixture, completely dissolve -1 tsp of salt in 1 cup of warm water.  Use a cool-mist humidifier to add moisture to the air. This can help you breathe more easily.  Eating and drinking    Drink enough fluid to keep your pee (urine) pale yellow.  Eat soups and other clear broths.  General instructions    Take over-the-counter and prescription medicines only as told by your doctor. These include cold medicines, fever reducers, and cough suppressants.  Do not use any products that contain nicotine or tobacco. These include cigarettes and e-cigarettes. If you need help quitting, ask your doctor.  Avoid being where people are smoking (avoid secondhand smoke).  Make sure you get regular shots and get the flu shot every year.  Keep all follow-up visits as told by your doctor. This is important.  How to avoid spreading infection to others    Wash your hands often with soap and water. If you do not have soap and water, use hand sanitizer.  Avoid touching your mouth, face, eyes, or nose.  Cough or sneeze into a tissue or your sleeve or elbow. Do not cough or sneeze into your hand or into the  air.  Contact a doctor if:  You are getting worse, not better.  You have any of these:  A fever.  Chills.  Brown or red mucus in your nose.  Yellow or brown fluid (discharge)coming from your nose.  Pain in your face, especially when you bend forward.  Swollen neck glands.  Pain with swallowing.  White areas in the back of your throat.  Get help right away if:  You have shortness of breath that gets worse.  You have very bad or constant:  Headache.  Ear pain.  Pain in your forehead, behind your eyes, and over your cheekbones (sinus pain).  Chest pain.  You have long-lasting (chronic) lung disease along with any of these:  Wheezing.  Long-lasting cough.  Coughing up blood.  A change in your usual mucus.  You have a stiff neck.  You have changes in your:  Vision.  Hearing.  Thinking.  Mood.  Summary  An upper respiratory infection (URI) is caused by a germ called a virus. The most common type of URI is often called "the common cold."  URIs usually get better within 7-10 days.  Take over-the-counter and prescription medicines only as told by your doctor.  This information is not intended to replace advice given to you by your health care provider. Make sure you discuss any questions you have with your health care provider.  Document   Revised: 04/25/2020 Document Reviewed: 04/25/2020  Elsevier Patient Education  2022 Elsevier Inc.

## 2021-03-14 NOTE — Progress Notes (Signed)
[name], Tonya Arnold are scheduled for a virtual visit with your provider today.    Just as we do with appointments in the office, we must obtain your consent to participate.  Your consent will be active for this visit and any virtual visit you may have with one of our providers in the next 365 days.    If you have a MyChart account, I can also send a copy of this consent to you electronically.  All virtual visits are billed to your insurance company just like a traditional visit in the office.  As this is a virtual visit, video technology does not allow for your provider to perform a traditional examination.  This may limit your provider's ability to fully assess your condition.  If your provider identifies any concerns that need to be evaluated in person or the need to arrange testing such as labs, EKG, etc, we will make arrangements to do so.    Although advances in technology are sophisticated, we cannot ensure that it will always work on either your end or our end.  If the connection with a video visit is poor, we may have to switch to a telephone visit.  With either a video or telephone visit, we are not always able to ensure that we have a secure connection.   I need to obtain your verbal consent now.   Are you willing to proceed with your visit today?   Tonya Arnold has provided verbal consent on 03/14/2021 for a virtual visit (video or telephone).   Tonya Loveless, PA-C 03/14/2021  12:10 PM  Virtual Visit Consent   Tonya Arnold, you are scheduled for a virtual visit with a [facility] provider today.     Just as with appointments in the office, your consent must be obtained to participate.  Your consent will be active for this visit and any virtual visit you may have with one of our providers in the next 365 days.     If you have a MyChart account, a copy of this consent can be sent to you electronically.  All virtual visits are billed to your insurance company  just like a traditional visit in the office.    As this is a virtual visit, video technology does not allow for your provider to perform a traditional examination.  This may limit your provider's ability to fully assess your condition.  If your provider identifies any concerns that need to be evaluated in person or the need to arrange testing (such as labs, EKG, etc.), we will make arrangements to do so.     Although advances in technology are sophisticated, we cannot ensure that it will always work on either your end or our end.  If the connection with a video visit is poor, the visit may have to be switched to a telephone visit.  With either a video or telephone visit, we are not always able to ensure that we have a secure connection.     I need to obtain your verbal consent now.   Are you willing to proceed with your visit today?    Tonya Arnold has provided verbal consent on 03/14/2021 for a virtual visit (video or telephone).   Tonya Loveless, PA-C   Date: 03/14/2021 12:10 PM   Virtual Visit via Video Note   I, Tonya Arnold, connected with  Tonya Arnold  ([id number], 13-Jul-1986) on 03/14/21 at 12:00 PM EDT by a video-enabled telemedicine application and  verified that I am speaking with the correct person using two identifiers.  Location: Patient: Virtual Visit Location Patient: Mobile in car, passenger Provider: Virtual Visit Location Provider: Home Office   I discussed the limitations of evaluation and management by telemedicine and the availability of in person appointments. The patient expressed understanding and agreed to proceed.    History of Present Illness: Tonya Arnold is a 35 y.o. who identifies as a female who was assigned female at birth, and is being seen today for upper respiratory symptoms.  HPI: URI  This is a new problem. The current episode started in the past 7 days (wednesday). The problem has been gradually worsening.  The fever has been present for Less than 1 day. Associated symptoms include chest pain (chest burning), congestion, coughing, headaches, rhinorrhea, sinus pain and a sore throat. Pertinent negatives include no ear pain or plugged ear sensation. Associated symptoms comments: Fatigue, body aches, chills, laryngitis. She has tried acetaminophen, increased fluids and NSAIDs (flonase) for the symptoms. The treatment provided no relief.     Problems: There are no problems to display for this patient.   Allergies:  Allergies  Allergen Reactions   Doxycycline Hyclate Other (See Comments)   Amoxicillin Rash and Other (See Comments)    Pt states she is not allergic to penicillin or any other antibiotic   Medications:  Current Outpatient Medications:    albuterol (VENTOLIN HFA) 108 (90 Base) MCG/ACT inhaler, Inhale 2 puffs into the lungs every 6 (six) hours as needed for wheezing or shortness of breath., Disp: 8 g, Rfl: 0   benzonatate (TESSALON) 100 MG capsule, Take 1 capsule (100 mg total) by mouth 3 (three) times daily as needed., Disp: 30 capsule, Rfl: 0   Phenylephrine-guaiFENesin 10-400 MG TABS, Take 1 tablet by mouth every 8 (eight) hours as needed., Disp: 30 tablet, Rfl: 0   promethazine-dextromethorphan (PROMETHAZINE-DM) 6.25-15 MG/5ML syrup, Take 5 mLs by mouth at bedtime as needed for cough., Disp: 118 mL, Rfl: 0   ibuprofen (ADVIL) 200 MG tablet, Take 440 mg by mouth every 6 (six) hours as needed for fever, headache or mild pain., Disp: , Rfl:    KARIVA 0.15-0.02/0.01 MG (21/5) tablet, Take 1 tablet by mouth daily., Disp: , Rfl:    Multiple Vitamin (MULTIVITAMIN WITH MINERALS) TABS tablet, Take 1 tablet by mouth daily., Disp: , Rfl:    naproxen sodium (ALEVE) 220 MG tablet, Take 440 mg by mouth 2 (two) times daily as needed (headache/pain)., Disp: , Rfl:    VITAMIN D PO, Take 1 capsule by mouth daily., Disp: , Rfl:   Observations/Objective: Patient is well-developed, well-nourished in no  acute distress.  Resting comfortably in car, traveling home Head is normocephalic, atraumatic.  No labored breathing. No adventitious lung sounds noted Speech is clear and coherent with logical content.  Patient is alert and oriented at baseline.  Patient is hoarse  Assessment and Plan: 1. Viral URI with cough - albuterol (VENTOLIN HFA) 108 (90 Base) MCG/ACT inhaler; Inhale 2 puffs into the lungs every 6 (six) hours as needed for wheezing or shortness of breath.  Dispense: 8 g; Refill: 0 - benzonatate (TESSALON) 100 MG capsule; Take 1 capsule (100 mg total) by mouth 3 (three) times daily as needed.  Dispense: 30 capsule; Refill: 0 - promethazine-dextromethorphan (PROMETHAZINE-DM) 6.25-15 MG/5ML syrup; Take 5 mLs by mouth at bedtime as needed for cough.  Dispense: 118 mL; Refill: 0 - Phenylephrine-guaiFENesin 10-400 MG TABS; Take 1 tablet by mouth every 8 (  eight) hours as needed.  Dispense: 30 tablet; Refill: 0 - Suspect covid 19 - Advised to isolate once home - Symptomatic management of choice - Albuterol, tessalon, promethazine DM and Mucinex D all sent in for patient - Advised to call once Covid testing complete if possible, if interested in antiviral managment  Follow Up Instructions: I discussed the assessment and treatment plan with the patient. The patient was provided an opportunity to ask questions and all were answered. The patient agreed with the plan and demonstrated an understanding of the instructions.  A copy of instructions were sent to the patient via MyChart.  The patient was advised to call back or seek an in-person evaluation if the symptoms worsen or if the condition fails to improve as anticipated.  Time:  I spent 12 minutes with the patient via telehealth technology discussing the above problems/concerns.    Tonya Loveless, PA-C

## 2021-04-05 ENCOUNTER — Other Ambulatory Visit: Payer: Self-pay | Admitting: Physician Assistant

## 2021-04-05 DIAGNOSIS — J069 Acute upper respiratory infection, unspecified: Secondary | ICD-10-CM

## 2021-06-17 ENCOUNTER — Telehealth: Payer: BC Managed Care – PPO | Admitting: Physician Assistant

## 2021-06-17 DIAGNOSIS — I889 Nonspecific lymphadenitis, unspecified: Secondary | ICD-10-CM

## 2021-06-17 MED ORDER — SULFAMETHOXAZOLE-TRIMETHOPRIM 800-160 MG PO TABS: 1.0000 | ORAL_TABLET | Freq: Two times a day (BID) | ORAL | 0 refills | Status: DC

## 2021-06-17 NOTE — Progress Notes (Signed)
Virtual Visit Consent   Laura-Lee Villegas, you are scheduled for a virtual visit with a [facility] provider today.     Just as with appointments in the office, your consent must be obtained to participate.  Your consent will be active for this visit and any virtual visit you may have with one of our providers in the next 365 days.     If you have a MyChart account, a copy of this consent can be sent to you electronically.  All virtual visits are billed to your insurance company just like a traditional visit in the office.    As this is a virtual visit, video technology does not allow for your provider to perform a traditional examination.  This may limit your provider's ability to fully assess your condition.  If your provider identifies any concerns that need to be evaluated in person or the need to arrange testing (such as labs, EKG, etc.), we will make arrangements to do so.     Although advances in technology are sophisticated, we cannot ensure that it will always work on either your end or our end.  If the connection with a video visit is poor, the visit may have to be switched to a telephone visit.  With either a video or telephone visit, we are not always able to ensure that we have a secure connection.     I need to obtain your verbal consent now.   Are you willing to proceed with your visit today?    Larrissa Stivers has provided verbal consent on 06/17/2021 for a virtual visit (video or telephone).   Tonya Arnold, [place]   Date: 06/17/2021 10:56 AM   Virtual Visit via Video Note   I, Tonya Arnold, connected with  Tonya Arnold  ([id number], 1985/10/19) on 06/17/21 at 10:45 AM EDT by a video-enabled telemedicine application and verified that I am speaking with the correct person using two identifiers.  Location: Patient: Virtual Visit Location Patient: Home Provider: Virtual Visit Location Provider: Home Office   I discussed the limitations  of evaluation and management by telemedicine and the availability of in person appointments. The patient expressed understanding and agreed to proceed.    History of Present Illness: Tonya Arnold is a 35 y.o. who identifies as a female who was assigned female at birth, and is being seen today for throat symptoms starting last night. Notes right-sided anterior cervical adenopathy with tenderness, now worsened with some mild swelling in the area. Denies any overlying skin changes. Denies overt sore throat. Pain in the area of tenderness with swallowing. Denies fevers, chills, nasal congestion, chest congestion. Does note seasonal allergies but not bothering her currently. Denies ear pain on affected side.. Denies recent travel or sick contact.  HPI: HPI  Problems: There are no problems to display for this patient.   Allergies:  Allergies  Allergen Reactions   Doxycycline Hyclate Other (See Comments)   Amoxicillin Rash and Other (See Comments)    Pt states she is not allergic to penicillin or any other antibiotic   Medications:  Current Outpatient Medications:    sulfamethoxazole-trimethoprim (BACTRIM DS) 800-160 MG tablet, Take 1 tablet by mouth 2 (two) times daily., Disp: 14 tablet, Rfl: 0   ibuprofen (ADVIL) 200 MG tablet, Take 440 mg by mouth every 6 (six) hours as needed for fever, headache or mild pain., Disp: , Rfl:    KARIVA 0.15-0.02/0.01 MG (21/5) tablet, Take 1 tablet by mouth daily.,  Disp: , Rfl:    Multiple Vitamin (MULTIVITAMIN WITH MINERALS) TABS tablet, Take 1 tablet by mouth daily., Disp: , Rfl:    naproxen sodium (ALEVE) 220 MG tablet, Take 440 mg by mouth 2 (two) times daily as needed (headache/pain)., Disp: , Rfl:    VITAMIN D PO, Take 1 capsule by mouth daily., Disp: , Rfl:   Observations/Objective: Patient is well-developed, well-nourished in no acute distress.  Resting comfortably at home.  Head is normocephalic, atraumatic.  No labored breathing. Speech  is clear and coherent with logical content.  Patient is alert and oriented at baseline.  Able to visualize oropharynx -- No appreciable erythema. No tonsillar swelling. Uvula midline. Area of concern if R anterior cervical and submandibular region with some noted mild swelling. Localized tenderness with patient palpation.   Assessment and Plan: 1. Lymphadenitis - sulfamethoxazole-trimethoprim (BACTRIM DS) 800-160 MG tablet; Take 1 tablet by mouth 2 (two) times daily.  Dispense: 14 tablet; Refill: 0 Discussed could be reactive lymph node due to start of viral infection versus infected cyst versus lymphadenitis. Will give empiric antibiotics in case of lymphadenitis. She is penicillin allergic so will use Bactrim DS BID x 7 days. Supportive measures and OTC medications reviewed. Strict in-person evaluation precautions reviewed.   Follow Up Instructions: I discussed the assessment and treatment plan with the patient. The patient was provided an opportunity to ask questions and all were answered. The patient agreed with the plan and demonstrated an understanding of the instructions.  A copy of instructions were sent to the patient via MyChart unless otherwise noted below.   The patient was advised to call back or seek an in-person evaluation if the symptoms worsen or if the condition fails to improve as anticipated.  Time:  I spent 11 minutes with the patient via telehealth technology discussing the above problems/concerns.    Tonya Climes, PA-C

## 2021-06-17 NOTE — Patient Instructions (Signed)
  Estrella Myrtle, thank you for joining Piedad Climes, PA-C for today's virtual visit.  While this provider is not your primary care provider (PCP), if your PCP is located in our provider database this encounter information will be shared with them immediately following your visit.  Consent: (Patient) Tonya Arnold provided verbal consent for this virtual visit at the beginning of the encounter.  Current Medications:  Current Outpatient Medications:    albuterol (VENTOLIN HFA) 108 (90 Base) MCG/ACT inhaler, Inhale 2 puffs into the lungs every 6 (six) hours as needed for wheezing or shortness of breath., Disp: 8 g, Rfl: 0   benzonatate (TESSALON) 100 MG capsule, Take 1 capsule (100 mg total) by mouth 3 (three) times daily as needed., Disp: 30 capsule, Rfl: 0   ibuprofen (ADVIL) 200 MG tablet, Take 440 mg by mouth every 6 (six) hours as needed for fever, headache or mild pain., Disp: , Rfl:    KARIVA 0.15-0.02/0.01 MG (21/5) tablet, Take 1 tablet by mouth daily., Disp: , Rfl:    Multiple Vitamin (MULTIVITAMIN WITH MINERALS) TABS tablet, Take 1 tablet by mouth daily., Disp: , Rfl:    naproxen sodium (ALEVE) 220 MG tablet, Take 440 mg by mouth 2 (two) times daily as needed (headache/pain)., Disp: , Rfl:    Phenylephrine-guaiFENesin 10-400 MG TABS, Take 1 tablet by mouth every 8 (eight) hours as needed., Disp: 30 tablet, Rfl: 0   promethazine-dextromethorphan (PROMETHAZINE-DM) 6.25-15 MG/5ML syrup, Take 5 mLs by mouth at bedtime as needed for cough., Disp: 118 mL, Rfl: 0   VITAMIN D PO, Take 1 capsule by mouth daily., Disp: , Rfl:    Medications ordered in this encounter:  No orders of the defined types were placed in this encounter.    *If you need refills on other medications prior to your next appointment, please contact your pharmacy*  Follow-Up: Call back or seek an in-person evaluation if the symptoms worsen or if the condition fails to improve as  anticipated.  Other Instructions Please keep well-hydrated and get plenty of rest. Alternate tylenol and ibuprofen if needed for discomfort. Keep an eye out for any new symptoms to notify us of. Take the antibiotic as directed. If you not worsening symptoms despite treatment, I want you to be seen in person.   If you have been instructed to have an in-person evaluation today at a local Urgent Care facility, please use the link below. It will take you to a list of all of our available [facility], including address, phone number and hours of operation. Please do not delay care.  [facility]  If you or a family member do not have a primary care provider, use the link below to schedule a visit and establish care. When you choose a [facility] primary care physician or advanced practice provider, you gain a long-term partner in health. Find a Primary Care Provider  Learn more about [facility]'s in-office and virtual care options: [facility] Get Care Now

## 2022-01-08 DIAGNOSIS — R69 Illness, unspecified: Secondary | ICD-10-CM | POA: Diagnosis not present

## 2022-01-08 DIAGNOSIS — Z01419 Encounter for gynecological examination (general) (routine) without abnormal findings: Secondary | ICD-10-CM | POA: Diagnosis not present

## 2022-01-29 ENCOUNTER — Ambulatory Visit (INDEPENDENT_AMBULATORY_CARE_PROVIDER_SITE_OTHER): Payer: 59 | Admitting: Emergency Medicine

## 2022-01-29 VITALS — BP 120/84 | HR 76 | Temp 98.4°F | Ht 64.0 in | Wt 164.5 lb

## 2022-01-29 DIAGNOSIS — G8929 Other chronic pain: Secondary | ICD-10-CM

## 2022-01-29 DIAGNOSIS — M792 Neuralgia and neuritis, unspecified: Secondary | ICD-10-CM

## 2022-01-29 DIAGNOSIS — M5441 Lumbago with sciatica, right side: Secondary | ICD-10-CM

## 2022-01-29 DIAGNOSIS — M5442 Lumbago with sciatica, left side: Secondary | ICD-10-CM

## 2022-01-29 DIAGNOSIS — M79604 Pain in right leg: Secondary | ICD-10-CM

## 2022-01-29 DIAGNOSIS — M79605 Pain in left leg: Secondary | ICD-10-CM | POA: Diagnosis not present

## 2022-01-29 NOTE — Progress Notes (Signed)
Tonya Arnold 36 y.o.   Chief Complaint  Patient presents with   Follow-up    HISTORY OF PRESENT ILLNESS: This is a 36 y.o. female complaining of intermittent chronic bilateral leg pain without known triggers for many months.  No injuries Describes pain as electrical sharp traveling pain Chronic low back pain as well. No other complaints or medical concerns today.  HPI   Prior to Admission medications   Medication Sig Start Date End Date Taking? Authorizing Provider  ibuprofen (ADVIL) 200 MG tablet Take 440 mg by mouth every 6 (six) hours as needed for fever, headache or mild pain.   Yes [provider]  KARIVA 0.15-0.02/0.01 MG (21/5) tablet Take 1 tablet by mouth daily. 06/15/20  Yes [provider]  Multiple Vitamin (MULTIVITAMIN WITH MINERALS) TABS tablet Take 1 tablet by mouth daily.   Yes [provider]  naproxen sodium (ALEVE) 220 MG tablet Take 440 mg by mouth 2 (two) times daily as needed (headache/pain).   Yes [provider]  sulfamethoxazole-trimethoprim (BACTRIM DS) 800-160 MG tablet Take 1 tablet by mouth 2 (two) times daily. 06/17/21  Yes Waldon Merl, PA-C  VITAMIN D PO Take 1 capsule by mouth daily.   Yes [provider]    Allergies  Allergen Reactions   Doxycycline Hyclate Other (See Comments)   Amoxicillin Rash and Other (See Comments)    Pt states she is not allergic to penicillin or any other antibiotic    There are no problems to display for this patient.   Past Medical History:  Diagnosis Date   History of chicken pox    History of fainting spells of unknown cause    Migraine     Past Surgical History:  Procedure Laterality Date   CESAREAN SECTION     CESAREAN SECTION  07/10/2012   Procedure: CESAREAN SECTION;  Surgeon: Mickel Baas, MD;  Location: [facility];  Service: Obstetrics;  Laterality: N/A;  Repeat cesarean section with delivery of baby girl at 1700.  Apgars 8/9.     Social History   Socioeconomic History   Marital status: Married    Spouse name: Not on file   Number of children: 2   Years of education: Not on file   Highest education level: Not on file  Occupational History   Not on file  Tobacco Use   Smoking status: Never   Smokeless tobacco: Never  Vaping Use   Vaping Use: Never used  Substance and Sexual Activity   Alcohol use: Yes    Comment: Occ   Drug use: No   Sexual activity: Yes  Other Topics Concern   Not on file  Social History Narrative   Married   Geophysical data processor   Social Determinants of Health   Financial Resource Strain: Not on file  Food Insecurity: Not on file  Transportation Needs: Not on file  Physical Activity: Not on file  Stress: Not on file  Social Connections: Not on file  Intimate Partner Violence: Not on file    Family History  Problem Relation Age of Onset   Asthma Mother    Diabetes Father    Hypertension Father    Kidney disease Father    Heart disease Father      Review of Systems  Constitutional: Negative.  Negative for chills and fever.  HENT: Negative.  Negative for congestion and sore throat.   Respiratory: Negative.  Negative for cough and shortness of breath.   Cardiovascular:  Negative.  Negative for chest pain and palpitations.  Gastrointestinal:  Negative for abdominal pain and nausea.  Genitourinary:  Negative for dysuria and hematuria.  Musculoskeletal:  Positive for back pain.  Skin: Negative.  Negative for rash.  Neurological: Negative.  Negative for dizziness and headaches.  All other systems reviewed and are negative. Today's Vitals   01/29/22 1501  BP: 120/84  Pulse: 76  Temp: 98.4 F (36.9 C)  TempSrc: Oral  SpO2: 96%  Weight: 164 lb 8 oz (74.6 kg)  Height: 5\' 4"  (1.626 m)   Body mass index is 28.24 kg/m.   Physical Exam Vitals reviewed.  Constitutional:      Appearance: Normal appearance.  HENT:     Head: Normocephalic.  Eyes:      Extraocular Movements: Extraocular movements intact.     Pupils: Pupils are equal, round, and reactive to light.  Cardiovascular:     Rate and Rhythm: Normal rate and regular rhythm.     Pulses: Normal pulses.     Heart sounds: Normal heart sounds.  Pulmonary:     Effort: Pulmonary effort is normal.     Breath sounds: Normal breath sounds.  Musculoskeletal:     Cervical back: No tenderness.  Lymphadenopathy:     Cervical: No cervical adenopathy.  Skin:    General: Skin is warm and dry.     Capillary Refill: Capillary refill takes less than 2 seconds.  Neurological:     General: No focal deficit present.     Mental Status: She is alert and oriented to person, place, and time.     Sensory: No sensory deficit.     Motor: No weakness.     Coordination: Coordination normal.     Gait: Gait normal.     Deep Tendon Reflexes: Reflexes normal.  Psychiatric:        Mood and Affect: Mood normal.        Behavior: Behavior normal.     ASSESSMENT & PLAN: Problem List Items Addressed This Visit       Nervous and Auditory   Chronic bilateral low back pain with bilateral sciatica    Seen by Ortho in the past.  Needs orthopedic re-evaluation probably also needs lumbar spine MRI.  Recommend also neurology evaluation for possible nerve conduction studies.       Relevant Orders   MR Lumbar Spine Wo Contrast     Other   Bilateral leg pain - Primary   Relevant Orders   Ambulatory referral to Neurology   Neuropathic pain   Relevant Orders   Ambulatory referral to Neurology   Patient Instructions  Neuropathic Pain Neuropathic pain is pain caused by damage to the nerves that are responsible for certain sensations in your body (sensory nerves). Neuropathic pain can make you more sensitive to pain. Even a minor sensation can feel very painful. This is usually a long-term (chronic) condition that can be difficult to treat. The type of pain differs from person to person. It may: Start  suddenly (acute), or it may develop slowly and become chronic. Come and go as damaged nerves heal, or it may stay at the same level for years. Cause emotional distress, loss of sleep, and a lower quality of life. What are the causes? The most common cause of this condition is diabetes. Many other diseases and conditions can also cause neuropathic pain. Causes of neuropathic pain can be classified as: Toxic. This is caused by medicines and chemicals. The most common causes of  toxic neuropathic pain is damage from medicines that kill cancer cells (chemotherapy) or alcohol abuse. Metabolic. This can be caused by: Diabetes. Lack of vitamins like B12. Traumatic. Any injury that cuts, crushes, or stretches a nerve can cause damage and pain. Compression-related. If a sensory nerve gets trapped or compressed for a long period of time, the blood supply to the nerve can be cut off. Vascular. Many blood vessel diseases can cause neuropathic pain by decreasing blood supply and oxygen to nerves. Autoimmune. This type of pain results from diseases in which the body's defense system (immune system) mistakenly attacks sensory nerves. Examples of autoimmune diseases that can cause neuropathic pain include lupus and multiple sclerosis. Infectious. Many types of viral infections can damage sensory nerves and cause pain. Shingles infection is a common cause of this type of pain. Inherited. Neuropathic pain can be a symptom of many diseases that are passed down through families (genetic). What increases the risk? You are more likely to develop this condition if: You have diabetes. You smoke. You drink too much alcohol. You are taking certain medicines, including chemotherapy or medicines that treat immune system disorders. What are the signs or symptoms? The main symptom is pain. Neuropathic pain is often described as: Burning. Shock-like. Stinging. Hot or cold. Itching. How is this diagnosed? No single test  can diagnose neuropathic pain. It is diagnosed based on: A physical exam and your symptoms. Your health care provider will ask you about your pain. You may be asked to use a pain scale to describe how bad your pain is. Tests. These may be done to see if you have a cause and location of any nerve damage. They include: Nerve conduction studies and electromyography to test how well nerve signals travel through your nerves and muscles (electrodiagnostic testing). Skin biopsy to evaluate for small fiber neuropathy. Imaging studies, such as: X-rays. CT scan. MRI. How is this treated? Treatment for neuropathic pain may change over time. You may need to try different treatment options or a combination of treatments. Some options include: Treating the underlying cause of the neuropathy, such as diabetes, kidney disease, or vitamin deficiencies. Stopping medicines that can cause neuropathy, such as chemotherapy. Medicine to relieve pain. Medicines may include: Prescription or over-the-counter pain medicine. Anti-seizure medicine. Antidepressant medicines. Pain-relieving patches or creams that are applied to painful areas of skin. A medicine to numb the area (local anesthetic), which can be injected as a nerve block. Transcutaneous nerve stimulation. This uses electrical currents to block painful nerve signals. The treatment is painless. Alternative treatments, such as: Acupuncture. Meditation. Massage. Occupational or physical therapy. Pain management programs. Counseling. Follow these instructions at home: Medicines  Take over-the-counter and prescription medicines only as told by your health care provider. Ask your health care provider if the medicine prescribed to you: Requires you to avoid driving or using machinery. Can cause constipation. You may need to take these actions to prevent or treat constipation: Drink enough fluid to keep your urine pale yellow. Take over-the-counter or  prescription medicines. Eat foods that are high in fiber, such as beans, whole grains, and fresh fruits and vegetables. Limit foods that are high in fat and processed sugars, such as fried or sweet foods. Lifestyle  Have a good support system at home. Consider joining a chronic pain support group. Do not use any products that contain nicotine or tobacco. These products include cigarettes, chewing tobacco, and vaping devices, such as e-cigarettes. If you need help quitting, ask your health care  provider. Do not drink alcohol. General instructions Learn as much as you can about your condition. Work closely with all your health care providers to find the treatment plan that works best for you. Ask your health care provider what activities are safe for you. Keep all follow-up visits. This is important. Contact a health care provider if: Your pain treatments are not working. You are having side effects from your medicines. You are struggling with tiredness (fatigue), mood changes, depression, or anxiety. Get help right away if: You have thoughts of hurting yourself. Get help right away if you feel like you may hurt yourself or others, or have thoughts about taking your own life. Go to your nearest emergency room or: Call 911. Call the National Suicide Prevention Lifeline at 604-002-9226 or 988. This is open 24 hours a day. Text the Crisis Text Line at 248-395-7339. Summary Neuropathic pain is pain caused by damage to the nerves that are responsible for certain sensations in your body (sensory nerves). Neuropathic pain may come and go as damaged nerves heal, or it may stay at the same level for years. Neuropathic pain is usually a long-term condition that can be difficult to treat. Consider joining a chronic pain support group. This information is not intended to replace advice given to you by your health care provider. Make sure you discuss any questions you have with your health care  provider. Document Revised: 04/14/2021 Document Reviewed: 04/14/2021 Elsevier Patient Education  2023 Elsevier Inc.     Edwina Barth, MD [facility]

## 2022-01-29 NOTE — Patient Instructions (Signed)
Neuropathic Pain Neuropathic pain is pain caused by damage to the nerves that are responsible for certain sensations in your body (sensory nerves). Neuropathic pain can make you more sensitive to pain. Even a minor sensation can feel very painful. This is usually a long-term (chronic) condition that can be difficult to treat. The type of pain differs from person to person. It may: Start suddenly (acute), or it may develop slowly and become chronic. Come and go as damaged nerves heal, or it may stay at the same level for years. Cause emotional distress, loss of sleep, and a lower quality of life. What are the causes? The most common cause of this condition is diabetes. Many other diseases and conditions can also cause neuropathic pain. Causes of neuropathic pain can be classified as: Toxic. This is caused by medicines and chemicals. The most common causes of toxic neuropathic pain is damage from medicines that kill cancer cells (chemotherapy) or alcohol abuse. Metabolic. This can be caused by: Diabetes. Lack of vitamins like B12. Traumatic. Any injury that cuts, crushes, or stretches a nerve can cause damage and pain. Compression-related. If a sensory nerve gets trapped or compressed for a long period of time, the blood supply to the nerve can be cut off. Vascular. Many blood vessel diseases can cause neuropathic pain by decreasing blood supply and oxygen to nerves. Autoimmune. This type of pain results from diseases in which the body's defense system (immune system) mistakenly attacks sensory nerves. Examples of autoimmune diseases that can cause neuropathic pain include lupus and multiple sclerosis. Infectious. Many types of viral infections can damage sensory nerves and cause pain. Shingles infection is a common cause of this type of pain. Inherited. Neuropathic pain can be a symptom of many diseases that are passed down through families (genetic). What increases the risk? You are more likely to  develop this condition if: You have diabetes. You smoke. You drink too much alcohol. You are taking certain medicines, including chemotherapy or medicines that treat immune system disorders. What are the signs or symptoms? The main symptom is pain. Neuropathic pain is often described as: Burning. Shock-like. Stinging. Hot or cold. Itching. How is this diagnosed? No single test can diagnose neuropathic pain. It is diagnosed based on: A physical exam and your symptoms. Your health care provider will ask you about your pain. You may be asked to use a pain scale to describe how bad your pain is. Tests. These may be done to see if you have a cause and location of any nerve damage. They include: Nerve conduction studies and electromyography to test how well nerve signals travel through your nerves and muscles (electrodiagnostic testing). Skin biopsy to evaluate for small fiber neuropathy. Imaging studies, such as: X-rays. CT scan. MRI. How is this treated? Treatment for neuropathic pain may change over time. You may need to try different treatment options or a combination of treatments. Some options include: Treating the underlying cause of the neuropathy, such as diabetes, kidney disease, or vitamin deficiencies. Stopping medicines that can cause neuropathy, such as chemotherapy. Medicine to relieve pain. Medicines may include: Prescription or over-the-counter pain medicine. Anti-seizure medicine. Antidepressant medicines. Pain-relieving patches or creams that are applied to painful areas of skin. A medicine to numb the area (local anesthetic), which can be injected as a nerve block. Transcutaneous nerve stimulation. This uses electrical currents to block painful nerve signals. The treatment is painless. Alternative treatments, such as: Acupuncture. Meditation. Massage. Occupational or physical therapy. Pain management programs. Counseling. Follow   these instructions at  home: Medicines  Take over-the-counter and prescription medicines only as told by your health care provider. Ask your health care provider if the medicine prescribed to you: Requires you to avoid driving or using machinery. Can cause constipation. You may need to take these actions to prevent or treat constipation: Drink enough fluid to keep your urine pale yellow. Take over-the-counter or prescription medicines. Eat foods that are high in fiber, such as beans, whole grains, and fresh fruits and vegetables. Limit foods that are high in fat and processed sugars, such as fried or sweet foods. Lifestyle  Have a good support system at home. Consider joining a chronic pain support group. Do not use any products that contain nicotine or tobacco. These products include cigarettes, chewing tobacco, and vaping devices, such as e-cigarettes. If you need help quitting, ask your health care provider. Do not drink alcohol. General instructions Learn as much as you can about your condition. Work closely with all your health care providers to find the treatment plan that works best for you. Ask your health care provider what activities are safe for you. Keep all follow-up visits. This is important. Contact a health care provider if: Your pain treatments are not working. You are having side effects from your medicines. You are struggling with tiredness (fatigue), mood changes, depression, or anxiety. Get help right away if: You have thoughts of hurting yourself. Get help right away if you feel like you may hurt yourself or others, or have thoughts about taking your own life. Go to your nearest emergency room or: Call 911. Call the National Suicide Prevention Lifeline at 1-800-273-8255 or 988. This is open 24 hours a day. Text the Crisis Text Line at 741741. Summary Neuropathic pain is pain caused by damage to the nerves that are responsible for certain sensations in your body (sensory  nerves). Neuropathic pain may come and go as damaged nerves heal, or it may stay at the same level for years. Neuropathic pain is usually a long-term condition that can be difficult to treat. Consider joining a chronic pain support group. This information is not intended to replace advice given to you by your health care provider. Make sure you discuss any questions you have with your health care provider. Document Revised: 04/14/2021 Document Reviewed: 04/14/2021 Elsevier Patient Education  2023 Elsevier Inc.  

## 2022-02-02 ENCOUNTER — Encounter: Payer: Self-pay | Admitting: Emergency Medicine

## 2022-02-02 DIAGNOSIS — M792 Neuralgia and neuritis, unspecified: Secondary | ICD-10-CM | POA: Insufficient documentation

## 2022-02-02 DIAGNOSIS — M79604 Pain in right leg: Secondary | ICD-10-CM | POA: Insufficient documentation

## 2022-02-02 NOTE — Assessment & Plan Note (Addendum)
Seen by Ortho in the past.  Needs orthopedic re-evaluation probably also needs lumbar spine MRI.  Recommend also neurology evaluation for possible nerve conduction studies.

## 2022-02-28 ENCOUNTER — Other Ambulatory Visit: Payer: 59

## 2022-03-06 ENCOUNTER — Telehealth: Payer: Self-pay | Admitting: Emergency Medicine

## 2022-03-06 NOTE — Telephone Encounter (Signed)
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  She needs to follow-up with orthopedist and let them decide if this study is truly warranted.  Thanks.

## 2022-03-09 NOTE — Telephone Encounter (Signed)
Called patient and left message for in reference to provider recommendations

## 2022-04-14 ENCOUNTER — Encounter: Payer: Self-pay | Admitting: Neurology

## 2022-04-14 ENCOUNTER — Ambulatory Visit (INDEPENDENT_AMBULATORY_CARE_PROVIDER_SITE_OTHER): Payer: 59 | Admitting: Neurology

## 2022-04-14 VITALS — BP 125/82 | HR 71 | Ht 64.0 in | Wt 165.0 lb

## 2022-04-14 DIAGNOSIS — M7071 Other bursitis of hip, right hip: Secondary | ICD-10-CM | POA: Diagnosis not present

## 2022-04-14 DIAGNOSIS — M7072 Other bursitis of hip, left hip: Secondary | ICD-10-CM

## 2022-04-14 DIAGNOSIS — M5126 Other intervertebral disc displacement, lumbar region: Secondary | ICD-10-CM | POA: Diagnosis not present

## 2022-04-14 DIAGNOSIS — M5442 Lumbago with sciatica, left side: Secondary | ICD-10-CM | POA: Diagnosis not present

## 2022-04-14 DIAGNOSIS — M79604 Pain in right leg: Secondary | ICD-10-CM

## 2022-04-14 DIAGNOSIS — M5416 Radiculopathy, lumbar region: Secondary | ICD-10-CM | POA: Diagnosis not present

## 2022-04-14 DIAGNOSIS — G8929 Other chronic pain: Secondary | ICD-10-CM

## 2022-04-14 DIAGNOSIS — M79605 Pain in left leg: Secondary | ICD-10-CM

## 2022-04-14 DIAGNOSIS — M5441 Lumbago with sciatica, right side: Secondary | ICD-10-CM | POA: Diagnosis not present

## 2022-04-14 NOTE — Progress Notes (Signed)
[facility]  Provider:  Dr Vickey Huger Referring Provider: Georgina Quint, * Primary Care Physician:  Georgina Quint, MD  Chief Complaint  Patient presents with   New Patient (Initial Visit)    Rm 10, alone. Pt referred for bilateral leg pn, neuropathic pain and electric shock sensation. C/o of fatigue, brain fog, and stressed.  Pn is increased by long periods of walking. Lifting leg has helped w pain.     HPI:  Tonya Arnold is a 36 y.o. female and seen here upon referral from Dr. Alvy Bimler for a Consultation/ Evaluation of bilateral leg pain. I have seen this patient once in 07-2017 and this is a new patient referral and new problem.   She has seen the ED a year ago for the same leg pain.  She has for years reported bruising on her leg without known trauma. Leg pain can wake her up at night, sometimes has kept her up.  Leg pain is symmetric and bilateral. Pain starts at lower level and increases in intensity of cramping from flank towards the groin , anterior and lateral thigh and below the knees to the shin. Most pain is in anterior thigh.  She takes ibuprofen and tylenol to help with pain. She is feeling tired, fatigued and exhausted.  ED labs  normal WBC, no anemia, low platelets. aPTT was normal. CMET normal. Normal electrolytes.      ED visit 7-1-202: Dr Lorre Nick, MD / Tonya Arnold is a 35 y.o. female. 35 year old female presents with 2 days of bilateral lower extremity discomfort.  Discomfort starts in her thighs and goes down to her calf.  Denies any chest discomfort or shortness of breath.  No trouble with ambulation.  Denies any back discomfort.  No bowel or bladder dysfunction.  Pain is sharp and is worse at night.  No distal numbness or tingling in her feet.  Denies any rashes to her legs.  Symptoms have been persistent.  No prior history of same.  No treatment use prior to arrival.  Patient received Toradol and 4 mg  Morphine.  CBC and CMET were normal. CK was normal. Normal renal and hepatic function. Low WBC count- 2.9 ! Low platelets. Sed rate 6 ,        This patient reports onset of leg pain and bruising  over a period of several years,  onset 2017 ?   Family history of DM in father.   Review of Systems: Out of a complete 14 system review, the patient complains of only the following symptoms, and all other reviewed systems are negative.  Leg pain, cramping , deep pain .   Bruising: non traumatic unexplained only on thighs.   Epworth Sleepiness score: denies EDS.  FSS : 40/ 63 points.   Social History   Socioeconomic History   Marital status: Married    Spouse name: Onalee Hua   Number of children: 2   Years of education: Not on file   Highest education level: Associate degree: occupational, Scientist, product/process development, or vocational program  Occupational History   Not on file  Tobacco Use   Smoking status: Never   Smokeless tobacco: Never  Vaping Use   Vaping Use: Never used  Substance and Sexual Activity   Alcohol use: Yes    Comment: Occ   Drug use: No   Sexual activity: Yes  Other Topics Concern   Not on file  Social History Narrative   Lives at home with husband and children  R handed   Caffeine: 2 C of coffee a day   Geophysical data processor   Social Determinants of Health   Financial Resource Strain: Not on file  Food Insecurity: Not on file  Transportation Needs: Not on file  Physical Activity: Not on file  Stress: Not on file  Social Connections: Not on file  Intimate Partner Violence: Not on file    Family History  Problem Relation Age of Onset   Asthma Mother    Diabetes Father    Hypertension Father    Kidney disease Father    Heart disease Father     Past Medical History:  Diagnosis Date   History of chicken pox    History of fainting spells of unknown cause    Migraine     Past Surgical History:  Procedure Laterality Date   CESAREAN SECTION     CESAREAN  SECTION  07/10/2012   Procedure: CESAREAN SECTION;  Surgeon: Mickel Baas, MD;  Location: [facility];  Service: Obstetrics;  Laterality: N/A;  Repeat cesarean section with delivery of baby girl at 1700.  Apgars 8/9.    Current Outpatient Medications  Medication Sig Dispense Refill   ibuprofen (ADVIL) 200 MG tablet Take 440 mg by mouth every 6 (six) hours as needed for fever, headache or mild pain.     KARIVA 0.15-0.02/0.01 MG (21/5) tablet Take 1 tablet by mouth daily.     Multiple Vitamin (MULTIVITAMIN WITH MINERALS) TABS tablet Take 1 tablet by mouth daily.     naproxen sodium (ALEVE) 220 MG tablet Take 440 mg by mouth 2 (two) times daily as needed (headache/pain).     sulfamethoxazole-trimethoprim (BACTRIM DS) 800-160 MG tablet Take 1 tablet by mouth 2 (two) times daily. 14 tablet 0   VITAMIN D PO Take 1 capsule by mouth daily.     No current facility-administered medications for this visit.    Allergies as of 04/14/2022 - Review Complete 04/14/2022  Allergen Reaction Noted   Doxycycline hyclate Other (See Comments) 06/15/2017   Amoxicillin Rash and Other (See Comments) 11/23/2011    Vitals: BP 125/82   Pulse 71   Ht 5\' 4"  (1.626 m)   Wt 165 lb (74.8 kg)   BMI 28.32 kg/m  Last Weight:  Wt Readings from Last 1 Encounters:  04/14/22 165 lb (74.8 kg)   Last Height:   Ht Readings from Last 1 Encounters:  04/14/22 5\' 4"  (1.626 m)   Last BMI: 28 @LASTBMI  Physical exam:  General: The patient is awake, alert and appears not in acute distress.  The patient is well groomed. Head: Normocephalic, atraumatic.  Neck is supple. Goiter ?   Neck circumference:14" Cardiovascular:  Regular rate and palpable peripheral pulse:  Respiratory: clear to auscultation.  Mallampati: 1, Skin:  Without evidence of edema, or rash Trunk: BMI is28.3  and patient  has normal posture.   Neurologic exam : The patient is awake and alert, oriented to place and time.  Memory subjective  described as  intact.  There is a normal attention span & concentration ability.  Speech is fluent without  dysarthria, dysphonia or aphasia.  Mood and affect are appropriate.  Cranial nerves: Pupils are equal and briskly reactive to light. Funduscopic exam without  evidence of pallor or edema. Extraocular movements  in vertical and horizontal planes intact and without nystagmus. Visual fields by finger perimetry are intact. Hearing to finger rub intact.  Facial sensation intact to fine touch.  Facial motor strength is  symmetric and tongue and uvula move midline. Full ROM in neck.  Motor exam:   Normal tone and normal muscle bulk and symmetric normal strength in all extremities. Slight weakness of right foot dorsiflexion.  Grip Strength normal.  Proximal strength of shoulder muscles and hip flexors was symmetric.  Thigh circumference was  symmetric at 16.25 ".  Sensory:  Fine touch and vibration were tested . She felt less vibration over the left patella. Proprioception was tested in the upper extremities only and was  normal.  Coordination: Rapid alternating movements in the fingers/hands were normal.  Finger-to-nose maneuver was tested and showed no evidence of ataxia, dysmetria or tremor.  Gait and station: Patient walked without assistive device - tip toe walk was normal.  Core Strength within normal limits. Stance is stable and of normal base.  Tandem gait is intact . Romberg testing is normal.  Deep tendon reflexes: in the  upper and lower extremities are symmetric and not brisk . Babinski maneuver response is  downgoing.   Physical exam of the paraspinal areas cervically and thoracically shows no specific tenderness but there is tenderness at the iliosacral area, there is tenderness when pushing on both hips laterally which could be seen in bursitis.  There is tenderness towards the gluteus maximus, the patient reported an area of numbness above her C-section scar.  This however is a fairly  small patch.  Due to the symmetry, I would suspect an origin at the lumbal or lumbosacral area.     Total time for face to face interview and examination, for review of  images and laboratory testing, neurophysiology testing and pre-existing records, including out-of -network material , was 50 minutes.    Plan:  Treatment plan and additional workup planned after today includes:   1) I agree with lumbar area MRI , non contrast. Dr Alvy Bimler already ordered, no appointment yet established. Suspect L3-4 radiculopathy.  2) I would like to draw an inflammatory marker, rheumatological panel.   RV in 2-4 months with NP.   Melvyn Novas, MD  Certified in Neurology by ABP[employer] Certified in Sleep Medicine by [facility] Director of [facility].  [facility] [address] [place], [place] [address]

## 2022-04-16 ENCOUNTER — Telehealth: Payer: Self-pay | Admitting: Neurology

## 2022-04-16 NOTE — Telephone Encounter (Signed)
[employer] sent to GI they obtain auth 

## 2022-04-22 DIAGNOSIS — R232 Flushing: Secondary | ICD-10-CM | POA: Diagnosis not present

## 2022-04-22 DIAGNOSIS — G479 Sleep disorder, unspecified: Secondary | ICD-10-CM | POA: Diagnosis not present

## 2022-04-22 DIAGNOSIS — N951 Menopausal and female climacteric states: Secondary | ICD-10-CM | POA: Diagnosis not present

## 2022-04-22 DIAGNOSIS — Z6827 Body mass index (BMI) 27.0-27.9, adult: Secondary | ICD-10-CM | POA: Diagnosis not present

## 2022-04-22 DIAGNOSIS — Z1331 Encounter for screening for depression: Secondary | ICD-10-CM | POA: Diagnosis not present

## 2022-04-22 DIAGNOSIS — E785 Hyperlipidemia, unspecified: Secondary | ICD-10-CM | POA: Diagnosis not present

## 2022-04-22 DIAGNOSIS — Z1339 Encounter for screening examination for other mental health and behavioral disorders: Secondary | ICD-10-CM | POA: Diagnosis not present

## 2022-04-25 ENCOUNTER — Ambulatory Visit
Admission: RE | Admit: 2022-04-25 | Discharge: 2022-04-25 | Disposition: A | Discharge status: Home / Self Care | Payer: 59 | Source: Ambulatory Visit | Attending: Neurology | Admitting: Neurology

## 2022-04-25 DIAGNOSIS — M7071 Other bursitis of hip, right hip: Secondary | ICD-10-CM

## 2022-04-25 DIAGNOSIS — G8929 Other chronic pain: Secondary | ICD-10-CM

## 2022-04-25 DIAGNOSIS — M545 Low back pain, unspecified: Secondary | ICD-10-CM | POA: Diagnosis not present

## 2022-04-25 DIAGNOSIS — M79604 Pain in right leg: Secondary | ICD-10-CM

## 2022-04-25 DIAGNOSIS — M5416 Radiculopathy, lumbar region: Secondary | ICD-10-CM

## 2022-04-26 DIAGNOSIS — M79604 Pain in right leg: Secondary | ICD-10-CM | POA: Diagnosis not present

## 2022-04-27 ENCOUNTER — Telehealth: Payer: Self-pay | Admitting: Neurology

## 2022-04-27 ENCOUNTER — Encounter: Payer: Self-pay | Admitting: Neurology

## 2022-04-27 NOTE — Addendum Note (Signed)
Addended by: Melvyn Novas on: 04/27/2022 11:53 AM   Modules accepted: Orders

## 2022-04-27 NOTE — Telephone Encounter (Signed)
Sent to [facility] as urgent ph # [phone]

## 2022-04-28 NOTE — Telephone Encounter (Signed)
Called to review the results and there was no answer LVM informing that I sent the result through mychart for her to review.  Instructed pt to review that or call back to review.  A referral was sent to Neurosurgery yesterday for the pt.

## 2022-04-29 NOTE — Telephone Encounter (Signed)
Called and left detailed message relaying: "Dr Vickey Huger received the results of the MRI of the lumbar area. 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 Based off this report she would like for Tonya Arnold to refer to Neurosurgery to have them further evaluate and make sure there is nothing surgical needed. They may feel physical therapy or steroids may be beneficial. The referral has been placed and you should hear from someone  from that office within 2-3 weeks or so."  Asked her to call out office back if she has any questions or does not hear from neurosurgery to schedule appt.

## 2022-06-16 ENCOUNTER — Ambulatory Visit: Payer: Self-pay | Admitting: Neurology

## 2022-07-30 NOTE — Telephone Encounter (Signed)
Pt was scheduled for 06/16/22 with [facility]-per Heather patient was a no show to this appointment

## 2022-09-29 ENCOUNTER — Telehealth: Payer: 59 | Admitting: Family Medicine

## 2022-09-29 DIAGNOSIS — R6889 Other general symptoms and signs: Secondary | ICD-10-CM

## 2022-09-29 MED ORDER — OSELTAMIVIR PHOSPHATE 75 MG PO CAPS: 75.0000 mg | ORAL_CAPSULE | Freq: Two times a day (BID) | ORAL | 0 refills | Status: AC

## 2022-09-29 NOTE — Progress Notes (Signed)
Virtual Visit Consent   Tonya Arnold, you are scheduled for a virtual visit with a [facility] provider today. Just as with appointments in the office, your consent must be obtained to participate. Your consent will be active for this visit and any virtual visit you may have with one of our providers in the next 365 days. If you have a MyChart account, a copy of this consent can be sent to you electronically.  As this is a virtual visit, video technology does not allow for your provider to perform a traditional examination. This may limit your provider's ability to fully assess your condition. If your provider identifies any concerns that need to be evaluated in person or the need to arrange testing (such as labs, EKG, etc.), we will make arrangements to do so. Although advances in technology are sophisticated, we cannot ensure that it will always work on either your end or our end. If the connection with a video visit is poor, the visit may have to be switched to a telephone visit. With either a video or telephone visit, we are not always able to ensure that we have a secure connection.  By engaging in this virtual visit, you consent to the provision of healthcare and authorize for your insurance to be billed (if applicable) for the services provided during this visit. Depending on your insurance coverage, you may receive a charge related to this service.  I need to obtain your verbal consent now. Are you willing to proceed with your visit today? Tonya Arnold has provided verbal consent on 09/29/2022 for a virtual visit (video or telephone). Tonya Mayo, NP  Date: 09/29/2022 1:16 PM  Virtual Visit via Video Note   I, Tonya Arnold, connected with  Tonya Arnold  ([id number], [birth year]) on 09/29/22 at  1:15 PM EST by a video-enabled telemedicine application and verified that I am speaking with the correct person using two identifiers.  Location: Patient: Virtual  Visit Location Patient: Home Provider: Virtual Visit Location Provider: Home Office   I discussed the limitations of evaluation and management by telemedicine and the availability of in person appointments. The patient expressed understanding and agreed to proceed.    History of Present Illness: Tonya Arnold is a 37 y.o. who identifies as a female who was assigned female at birth, and is being seen today for body aches and sinus congestion.  Sunday 09/27/2022- two days ago- felt bad with body aches, felt feverish- didn't take temp-chills, face is achy and body aches, sore throat, congestion,  burning. Husband and daughter both sick. Flu in house in last 3 weeks. COVID negative.  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    Allergies:  Allergies  Allergen Reactions   Doxycycline Hyclate Other (See Comments)   Amoxicillin Rash and Other (See Comments)    Pt states she is not allergic to penicillin or any other antibiotic   Medications:  Current Outpatient Medications:    ibuprofen (ADVIL) 200 MG tablet, Take 440 mg by mouth every 6 (six) hours as needed for fever, headache or mild pain., Disp: , Rfl:    KARIVA 0.15-0.02/0.01 MG (21/5) tablet, Take 1 tablet by mouth daily., Disp: , Rfl:    Multiple Vitamin (MULTIVITAMIN WITH MINERALS) TABS tablet, Take 1 tablet by mouth daily., Disp: , Rfl:  naproxen sodium (ALEVE) 220 MG tablet, Take 440 mg by mouth 2 (two) times daily as needed (headache/pain)., Disp: , Rfl:    sulfamethoxazole-trimethoprim (BACTRIM DS) 800-160 MG tablet, Take 1 tablet by mouth 2 (two) times daily., Disp: 14 tablet, Rfl: 0   VITAMIN D PO, Take 1 capsule by mouth daily., Disp: , Rfl:   Observations/Objective: Patient is well-developed, well-nourished in no acute distress.  Resting comfortably  at home.   Head is normocephalic, atraumatic.  No labored breathing.  Speech is clear and coherent with logical content.  Patient is alert and oriented at baseline.  Congestion tone noted  Assessment and Plan: 1. Flu-like symptoms  - oseltamivir (TAMIFLU) 75 MG capsule; Take 1 capsule (75 mg total) by mouth 2 (two) times daily for 5 days.  Dispense: 10 capsule; Refill: 0  -Take meds as prescribed -Rest -Use a cool mist humidifier especially during the winter months when heat dries out the air. - Use saline nose sprays frequently to help soothe nasal passages and promote drainage. -stay hydrated by drinking plenty of fluids - Keep thermostat turn down low to prevent drying out sinuses - For any cough or congestion- robitussin DM or Delsym as needed - For fever or aches or pains- take tylenol or ibuprofen as directed on bottle             * for fevers greater than 101 orally you may alternate ibuprofen and tylenol every 3 hours.  If you do not improve you will need a follow up visit in person.                Reviewed side effects, risks and benefits of medication.    Patient acknowledged agreement and understanding of the plan.   Past Medical, Surgical, Social History, Allergies, and Medications have been Reviewed.     Follow Up Instructions: I discussed the assessment and treatment plan with the patient. The patient was provided an opportunity to ask questions and all were answered. The patient agreed with the plan and demonstrated an understanding of the instructions.  A copy of instructions were sent to the patient via MyChart unless otherwise noted below.    The patient was advised to call back or seek an in-person evaluation if the symptoms worsen or if the condition fails to improve as anticipated.  Time:  I spent 9 minutes with the patient via telehealth technology discussing the above problems/concerns.    Tonya Mayo, NP

## 2022-09-29 NOTE — Patient Instructions (Signed)
Andree Moro, thank you for joining Perlie Mayo, NP for today's virtual visit.  While this provider is not your primary care provider (PCP), if your PCP is located in our provider database this encounter information will be shared with them immediately following your visit.   [facility] account gives you access to today's visit and all your visits, tests, and labs performed at [facility] " click here if you don't have a [facility] account or go to [email or web address]  Consent: (Patient) Tonya Arnold provided verbal consent for this virtual visit at the beginning of the encounter.  Current Medications:  Current Outpatient Medications:    ibuprofen (ADVIL) 200 MG tablet, Take 440 mg by mouth every 6 (six) hours as needed for fever, headache or mild pain., Disp: , Rfl:    KARIVA 0.15-0.02/0.01 MG (21/5) tablet, Take 1 tablet by mouth daily., Disp: , Rfl:    Multiple Vitamin (MULTIVITAMIN WITH MINERALS) TABS tablet, Take 1 tablet by mouth daily., Disp: , Rfl:    naproxen sodium (ALEVE) 220 MG tablet, Take 440 mg by mouth 2 (two) times daily as needed (headache/pain)., Disp: , Rfl:    sulfamethoxazole-trimethoprim (BACTRIM DS) 800-160 MG tablet, Take 1 tablet by mouth 2 (two) times daily., Disp: 14 tablet, Rfl: 0   VITAMIN D PO, Take 1 capsule by mouth daily., Disp: , Rfl:    Medications ordered in this encounter:  No orders of the defined types were placed in this encounter.    *If you need refills on other medications prior to your next appointment, please contact your pharmacy*  Follow-Up: Call back or seek an in-person evaluation if the symptoms worsen or if the condition fails to improve as anticipated.  Cath[facility] [phone]  Other Instructions Influenza, Adult Influenza is also called "the flu." It is an infection in the lungs, nose, and throat (respiratory tract). It spreads easily from person to  person (is contagious). The flu causes symptoms that are like a cold, along with high fever and body aches. What are the causes? This condition is caused by the influenza virus. You can get the virus by: Breathing in droplets that are in the air after a person infected with the flu coughed or sneezed. Touching something that has the virus on it and then touching your mouth, nose, or eyes. What increases the risk? Certain things may make you more likely to get the flu. These include: Not washing your hands often. Having close contact with many people during cold and flu season. Touching your mouth, eyes, or nose without first washing your hands. Not getting a flu shot every year. You may have a higher risk for the flu, and serious problems, such as a lung infection (pneumonia), if you: Are older than 65. Are pregnant. Have a weakened disease-fighting system (immune system) because of a disease or because you are taking certain medicines. Have a long-term (chronic) condition, such as: Heart, kidney, or lung disease. Diabetes. Asthma. Have a liver disorder. Are very overweight (morbidly obese). Have anemia. What are the signs or symptoms? Symptoms usually begin suddenly and last 4-14 days. They may include: Fever and chills. Headaches, body aches, or muscle aches. Sore throat. Cough. Runny or stuffy (congested) nose. Feeling discomfort in your chest. Not wanting to eat as much as normal. Feeling weak or tired. Feeling dizzy. Feeling sick to your stomach or throwing up. How is this treated? If the flu is found early, you can  be treated with antiviral medicine. This can help to reduce how bad the illness is and how long it lasts. This may be given by mouth or through an IV tube. Taking care of yourself at home can help your symptoms get better. Your doctor may want you to: Take over-the-counter medicines. Drink plenty of fluids. The flu often goes away on its own. If you have very  bad symptoms or other problems, you may be treated in a hospital. Follow these instructions at home:     Activity Rest as needed. Get plenty of sleep. Stay home from work or school as told by your doctor. Do not leave home until you do not have a fever for 24 hours without taking medicine. Leave home only to go to your doctor. Eating and drinking Take an ORS (oral rehydration solution). This is a drink that is sold at pharmacies and stores. Drink enough fluid to keep your pee pale yellow. Drink clear fluids in small amounts as you are able. Clear fluids include: Water. Ice chips. Fruit juice mixed with water. Low-calorie sports drinks. Eat bland foods that are easy to digest. Eat small amounts as you are able. These foods include: Bananas. Applesauce. Rice. Lean meats. Toast. Crackers. Do not eat or drink: Fluids that have a lot of sugar or caffeine. Alcohol. Spicy or fatty foods. General instructions Take over-the-counter and prescription medicines only as told by your doctor. Use a cool mist humidifier to add moisture to the air in your home. This can make it easier for you to breathe. When using a cool mist humidifier, clean it daily. Empty water and replace with clean water. Cover your mouth and nose when you cough or sneeze. Wash your hands with soap and water often and for at least 20 seconds. This is also important after you cough or sneeze. If you cannot use soap and water, use alcohol-based hand sanitizer. Keep all follow-up visits. How is this prevented?  Get a flu shot every year. You may get the flu shot in late summer, fall, or winter. Ask your doctor when you should get your flu shot. Avoid contact with people who are sick during fall and winter. This is cold and flu season. Contact a doctor if: You get new symptoms. You have: Chest pain. Watery poop (diarrhea). A fever. Your cough gets worse. You start to have more mucus. You feel sick to your  stomach. You throw up. Get help right away if you: Have shortness of breath. Have trouble breathing. Have skin or nails that turn a bluish color. Have very bad pain or stiffness in your neck. Get a sudden headache. Get sudden pain in your face or ear. Cannot eat or drink without throwing up. These symptoms may represent a serious problem that is an emergency. Get medical help right away. Call your local emergency services (911 in the [place] Do not wait to see if the symptoms will go away. Do not drive yourself to the hospital. Summary Influenza is also called "the flu." It is an infection in the lungs, nose, and throat. It spreads easily from person to person. Take over-the-counter and prescription medicines only as told by your doctor. Getting a flu shot every year is the best way to not get the flu. This information is not intended to replace advice given to you by your health care provider. Make sure you discuss any questions you have with your health care provider. Document Revised: 04/05/2020 Document Reviewed: 04/05/2020 Elsevier Patient Education  [place].   If you have been instructed to have an in-person evaluation today at a local Urgent Care facility, please use the link below. It will take you to a list of all of our available [facility], including address, phone number and hours of operation. Please do not delay care.  [facility]  If you or a family member do not have a primary care provider, use the link below to schedule a visit and establish care. When you choose a [facility] primary care physician or advanced practice provider, you gain a long-term partner in health. Find a Primary Care Provider  Learn more about [facility]'s in-office and virtual care options: [facility]

## 2022-11-10 NOTE — Progress Notes (Signed)
This encounter was created in error - please disregard.

## 2023-01-20 DIAGNOSIS — Z01419 Encounter for gynecological examination (general) (routine) without abnormal findings: Secondary | ICD-10-CM | POA: Diagnosis not present

## 2023-01-24 ENCOUNTER — Telehealth: Payer: 59 | Admitting: Nurse Practitioner

## 2023-01-24 DIAGNOSIS — G43829 Menstrual migraine, not intractable, without status migrainosus: Secondary | ICD-10-CM

## 2023-01-24 MED ORDER — ONDANSETRON HCL 4 MG PO TABS: 4.0000 mg | ORAL_TABLET | Freq: Three times a day (TID) | ORAL | 0 refills | Status: DC | PRN

## 2023-01-24 MED ORDER — SUMATRIPTAN SUCCINATE 50 MG PO TABS: 50.0000 mg | ORAL_TABLET | Freq: Once | ORAL | 0 refills | Status: DC

## 2023-01-24 NOTE — Patient Instructions (Signed)
Tonya Arnold, thank you for joining Tonya Rigg, NP for today's virtual visit.  While this provider is not your primary care provider (PCP), if your PCP is located in our provider database this encounter information will be shared with them immediately following your visit.   A [facility] MyChart account gives you access to today's visit and all your visits, tests, and labs performed at [facility] " click here if you don't have a [facility] MyChart account or go to [email or web address]  Consent: (Patient) Tonya Arnold provided verbal consent for this virtual visit at the beginning of the encounter.  Current Medications:  Current Outpatient Medications:    ondansetron (ZOFRAN) 4 MG tablet, Take 1 tablet (4 mg total) by mouth every 8 (eight) hours as needed for nausea or vomiting., Disp: 21 tablet, Rfl: 0   SUMAtriptan (IMITREX) 50 MG tablet, Take 1-2 tablets (50-100 mg total) by mouth once for 1 dose. May repeat in 2 hours if headache persists or recurs. Do not take more than 2 tablets in 24 hr period, Disp: 10 tablet, Rfl: 0   ibuprofen (ADVIL) 200 MG tablet, Take 440 mg by mouth every 6 (six) hours as needed for fever, headache or mild pain., Disp: , Rfl:    KARIVA 0.15-0.02/0.01 MG (21/5) tablet, Take 1 tablet by mouth daily., Disp: , Rfl:    Multiple Vitamin (MULTIVITAMIN WITH MINERALS) TABS tablet, Take 1 tablet by mouth daily., Disp: , Rfl:    naproxen sodium (ALEVE) 220 MG tablet, Take 440 mg by mouth 2 (two) times daily as needed (headache/pain)., Disp: , Rfl:    sulfamethoxazole-trimethoprim (BACTRIM DS) 800-160 MG tablet, Take 1 tablet by mouth 2 (two) times daily., Disp: 14 tablet, Rfl: 0   VITAMIN D PO, Take 1 capsule by mouth daily., Disp: , Rfl:    Medications ordered in this encounter:  Meds ordered this encounter  Medications   ondansetron (ZOFRAN) 4 MG tablet    Sig: Take 1 tablet (4 mg total) by mouth every 8 (eight) hours as  needed for nausea or vomiting.    Dispense:  21 tablet    Refill:  0    Order Specific Question:   Supervising Provider    Answer:   Tonya Arnold [[id number]]   SUMAtriptan (IMITREX) 50 MG tablet    Sig: Take 1-2 tablets (50-100 mg total) by mouth once for 1 dose. May repeat in 2 hours if headache persists or recurs. Do not take more than 2 tablets in 24 hr period    Dispense:  10 tablet    Refill:  0    Order Specific Question:   Supervising Provider    Answer:   Tonya Arnold [[id number]]     *If you need refills on other medications prior to your next appointment, please contact your pharmacy*  Follow-Up: Call back or seek an in-person evaluation if the symptoms worsen or if the condition fails to improve as anticipated.  [facility] [phone]    If you have been instructed to have an in-person evaluation today at a local Urgent Care facility, please use the link below. It will take you to a list of all of our available [facility], including address, phone number and hours of operation. Please do not delay care.  [facility]  If you or a family member do not have a primary care provider, use the link below to schedule a visit and establish  care. When you choose a [facility] primary care physician or advanced practice provider, you gain a long-term partner in health. Find a Primary Care Provider  Learn more about Tonya Arnold's in-office and virtual care options: [facility] Get Care Now

## 2023-01-24 NOTE — Progress Notes (Signed)
Virtual Visit Consent   Tonya Arnold, you are scheduled for a virtual visit with a [facility] provider today. Just as with appointments in the office, your consent must be obtained to participate. Your consent will be active for this visit and any virtual visit you may have with one of our providers in the next 365 days. If you have a MyChart account, a copy of this consent can be sent to you electronically.  As this is a virtual visit, video technology does not allow for your provider to perform a traditional examination. This may limit your provider's ability to fully assess your condition. If your provider identifies any concerns that need to be evaluated in person or the need to arrange testing (such as labs, EKG, etc.), we will make arrangements to do so. Although advances in technology are sophisticated, we cannot ensure that it will always work on either your end or our end. If the connection with a video visit is poor, the visit may have to be switched to a telephone visit. With either a video or telephone visit, we are not always able to ensure that we have a secure connection.  By engaging in this virtual visit, you consent to the provision of healthcare and authorize for your insurance to be billed (if applicable) for the services provided during this visit. Depending on your insurance coverage, you may receive a charge related to this service.  I need to obtain your verbal consent now. Are you willing to proceed with your visit today? Kaity Vele has provided verbal consent on 01/24/2023 for a virtual visit (video or telephone). Claiborne Rigg, NP  Date: 01/24/2023 8:49 AM  Virtual Visit via Video Note   I, Claiborne Rigg, connected with  Drexel Alavez  ([id number], [birth year]) on 01/24/23 at  8:45 AM EDT by a video-enabled telemedicine application and verified that I am speaking with the correct person using two identifiers.  Location: Patient:  Virtual Visit Location Patient: Home Provider: Virtual Visit Location Provider: Home Office   I discussed the limitations of evaluation and management by telemedicine and the availability of in person appointments. The patient expressed understanding and agreed to proceed.    History of Present Illness: Tonya Arnold is a 37 y.o. who identifies as a female who was assigned female at birth, and is being seen today for menstrual migraine.  Mrs. Crittendon states she has been experiencing a severe headache with nausea and vomiting since yesterday. Last vomiting episode was last night around 1130.  She is currently experiencing fatigue, headache and body aches from throwing up. She has experienced migraines in the past but none quite so intense as today's. She does not endorse fever, acute visual changes and associated CNS symptoms, weakness or difficulty with speech.  She tried tylenol yesterday with some relief of headache however currently her headache is still persistent.   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 May repeat in 2 hours if headache persists or recurs. Do not take more than 2 tablets in 24 hr period, Disp: 10 tablet, Rfl: 0   ibuprofen (ADVIL) 200 MG tablet, Take 440 mg by mouth every 6 (six) hours as needed for fever,  headache or mild pain., Disp: , Rfl:    KARIVA 0.15-0.02/0.01 MG (21/5) tablet, Take 1 tablet by mouth daily., Disp: , Rfl:    Multiple Vitamin (MULTIVITAMIN WITH MINERALS) TABS tablet, Take 1 tablet by mouth daily., Disp: , Rfl:    naproxen sodium (ALEVE) 220 MG tablet, Take 440 mg by mouth 2 (two) times daily as needed (headache/pain)., Disp: , Rfl:    sulfamethoxazole-trimethoprim (BACTRIM DS) 800-160 MG tablet, Take 1 tablet by mouth 2 (two) times daily., Disp: 14 tablet, Rfl: 0   VITAMIN D PO, Take 1 capsule by mouth daily., Disp: , Rfl:   Observations/Objective: Patient is well-developed, well-nourished in no acute distress.  Resting comfortably at home.  Head is normocephalic, atraumatic.  No labored breathing.  Speech is clear and coherent with logical content.  Patient is alert and oriented at baseline.    Assessment and Plan: 1. Menstrual migraine without status migrainosus, not intractable - ondansetron (ZOFRAN) 4 MG tablet; Take 1 tablet (4 mg total) by mouth every 8 (eight) hours as needed for nausea or vomiting.  Dispense: 21 tablet; Refill: 0 - SUMAtriptan (IMITREX) 50 MG tablet; Take 1-2 tablets (50-100 mg total) by mouth once for 1 dose. May repeat in 2 hours if headache persists or recurs. Do not take more than 2 tablets in 24 hr period  Dispense: 10 tablet; Refill: 0   Follow Up Instructions: I discussed the assessment and treatment plan with the patient. The patient was provided an opportunity to ask questions and all were answered. The patient agreed with the plan and demonstrated an understanding of the instructions.  A copy of instructions were sent to the patient via MyChart unless otherwise noted below.    The patient was advised to call back or seek an in-person evaluation if the symptoms worsen or if the condition fails to improve as anticipated.  Time:  I spent 12 minutes with the patient via telehealth technology discussing the above problems/concerns.     Claiborne Rigg, NP

## 2023-06-07 DIAGNOSIS — L503 Dermatographic urticaria: Secondary | ICD-10-CM | POA: Diagnosis not present

## 2023-08-30 ENCOUNTER — Ambulatory Visit: Payer: 59 | Admitting: Family Medicine

## 2023-08-30 NOTE — Progress Notes (Deleted)
   Acute Office Visit  Subjective:     Patient ID: Tonya Arnold, female    DOB: [birth year], 37 y.o.   MRN: [id number]  No chief complaint on file.   HPI Patient is in today for ***  ROS Per HPI      Objective:    There were no vitals taken for this visit.   Physical Exam Vitals and nursing note reviewed.  Constitutional:      Appearance: Normal appearance. She is normal weight.  HENT:     Head: Normocephalic and atraumatic.     Right Ear: Tympanic membrane and ear canal normal.     Left Ear: Tympanic membrane and ear canal normal.     Nose: Nose normal.  Eyes:     Extraocular Movements: Extraocular movements intact.     Pupils: Pupils are equal, round, and reactive to light.  Cardiovascular:     Rate and Rhythm: Normal rate and regular rhythm.     Heart sounds: Normal heart sounds.  Pulmonary:     Effort: Pulmonary effort is normal.     Breath sounds: Normal breath sounds.  Musculoskeletal:        General: Normal range of motion.     Cervical back: Normal range of motion.  Neurological:     General: No focal deficit present.     Mental Status: She is alert and oriented to person, place, and time.  Psychiatric:        Mood and Affect: Mood normal.        Thought Content: Thought content normal.   No results found for any visits on 08/30/23.      Assessment & Plan:  ***  No orders of the defined types were placed in this encounter.   No follow-ups on file.  Moshe Cipro, FNP

## 2023-08-31 ENCOUNTER — Ambulatory Visit (INDEPENDENT_AMBULATORY_CARE_PROVIDER_SITE_OTHER): Payer: 59 | Admitting: Family Medicine

## 2023-08-31 ENCOUNTER — Encounter: Payer: Self-pay | Admitting: Family Medicine

## 2023-08-31 VITALS — BP 122/74 | HR 83 | Temp 98.6°F | Ht 64.0 in | Wt 173.4 lb

## 2023-08-31 DIAGNOSIS — R591 Generalized enlarged lymph nodes: Secondary | ICD-10-CM

## 2023-08-31 LAB — CBC WITH DIFFERENTIAL/PLATELET
Basophils Absolute: 0 10*3/uL (ref 0.0–0.1)
Basophils Relative: 0.4 % (ref 0.0–3.0)
Eosinophils Absolute: 0.2 10*3/uL (ref 0.0–0.7)
Eosinophils Relative: 2.3 % (ref 0.0–5.0)
HCT: 40 % (ref 36.0–46.0)
Hemoglobin: 13.1 g/dL (ref 12.0–15.0)
Lymphocytes Relative: 25.1 % (ref 12.0–46.0)
Lymphs Abs: 1.6 10*3/uL (ref 0.7–4.0)
MCHC: 32.6 g/dL (ref 30.0–36.0)
MCV: 88 fL (ref 78.0–100.0)
Monocytes Absolute: 0.5 10*3/uL (ref 0.1–1.0)
Monocytes Relative: 7.4 % (ref 3.0–12.0)
Neutro Abs: 4.2 10*3/uL (ref 1.4–7.7)
Neutrophils Relative %: 64.8 % (ref 43.0–77.0)
Platelets: 215 10*3/uL (ref 150.0–400.0)
RBC: 4.54 Mil/uL (ref 3.87–5.11)
RDW: 12.7 % (ref 11.5–15.5)
WBC: 6.5 10*3/uL (ref 4.0–10.5)

## 2023-08-31 LAB — COMPREHENSIVE METABOLIC PANEL
ALT: 13 U/L (ref 0–35)
AST: 17 U/L (ref 0–37)
Albumin: 4.4 g/dL (ref 3.5–5.2)
Alkaline Phosphatase: 54 U/L (ref 39–117)
BUN: 12 mg/dL (ref 6–23)
CO2: 26 meq/L (ref 19–32)
Calcium: 9 mg/dL (ref 8.4–10.5)
Chloride: 104 meq/L (ref 96–112)
Creatinine, Ser: 0.81 mg/dL (ref 0.40–1.20)
GFR: 91.89 mL/min (ref >60.00)
Glucose, Bld: 90 mg/dL (ref 70–99)
Potassium: 4 meq/L (ref 3.5–5.1)
Sodium: 137 meq/L (ref 135–145)
Total Bilirubin: 0.5 mg/dL (ref 0.2–1.2)
Total Protein: 7.1 g/dL (ref 6.0–8.3)

## 2023-08-31 NOTE — Progress Notes (Signed)
   Acute Office Visit  Subjective:     Patient ID: Tonya Arnold, female    DOB: May 04, 1986, 37 y.o.   MRN: [id number]  Chief Complaint  Patient presents with   Cyst    Nodule behind left ear noticed within the last couple of weeks. Patient has had something similar pop up on both sides of their neck which had eventually passed. Nodule is hard to the touch, no pain, no discoloration, size approximately 1 cm.    HPI Patient is in today for evaluation of knot behind the left ear for the last couple of weeks.  Reports similar knots on her neck in the past that have resolved on their own. Reports that a couple weeks ago she had her ears pierced with her daughter.  Noted the nodule after this. Reports that the area is nontender, no discharge, no redness, no heat, no other concerns today.  ROS Per HPI      Objective:    BP 122/74   Pulse 83   Temp 98.6 F (37 C)   Ht 5' 4 (1.626 m)   Wt 173 lb 6.4 oz (78.7 kg)   SpO2 98%   BMI 29.76 kg/m    Physical Exam Vitals and nursing note reviewed.  Constitutional:      General: She is not in acute distress.    Appearance: Normal appearance. She is normal weight.  HENT:     Head: Normocephalic and atraumatic.     Right Ear: Tympanic membrane and ear canal normal.     Left Ear: Tympanic membrane and ear canal normal.  Eyes:     Extraocular Movements: Extraocular movements intact.     Pupils: Pupils are equal, round, and reactive to light.  Pulmonary:     Effort: Pulmonary effort is normal.  Musculoskeletal:        General: Normal range of motion.     Cervical back: Normal range of motion.  Lymphadenopathy:     Head:     Left side of head: Posterior auricular adenopathy present.     Cervical: No cervical adenopathy.     Comments: Left posterior auricular lymphadenopathy.  About 1 cm in diameter, firm rubbery nodule present.  No erythema, no discharge, nontender  Neurological:     General: No focal deficit present.      Mental Status: She is alert and oriented to person, place, and time.     No results found for any visits on 08/31/23.      Assessment & Plan:  1. Lymphadenopathy (Primary)  - CBC with Differential/Platelet - Comprehensive metabolic panel -Discussed that this is likely due to ear piercing healing, will check labs to rule out infection other white cell abnormalities -If abnormal, will order ultrasound -May use NSAIDS prn  No orders of the defined types were placed in this encounter.   Return if symptoms worsen or fail to improve.  Corean Ku, FNP

## 2023-08-31 NOTE — Patient Instructions (Addendum)
Continue to clean the piercing as instructed.  We are checking labs today, will be in contact with any results that require further attention  Follow-up with me for new or worsening symptoms.

## 2023-09-13 ENCOUNTER — Telehealth: Payer: 59 | Admitting: Physician Assistant

## 2023-09-13 DIAGNOSIS — J019 Acute sinusitis, unspecified: Secondary | ICD-10-CM

## 2023-09-13 DIAGNOSIS — B9789 Other viral agents as the cause of diseases classified elsewhere: Secondary | ICD-10-CM | POA: Diagnosis not present

## 2023-09-13 MED ORDER — AZITHROMYCIN 250 MG PO TABS: ORAL_TABLET | ORAL | 0 refills | Status: AC

## 2023-09-13 MED ORDER — PSEUDOEPH-BROMPHEN-DM 30-2-10 MG/5ML PO SYRP: 5.0000 mL | ORAL_SOLUTION | Freq: Four times a day (QID) | ORAL | 0 refills | Status: DC | PRN

## 2023-09-13 MED ORDER — FLUTICASONE PROPIONATE 50 MCG/ACT NA SUSP: 2.0000 | Freq: Every day | NASAL | 0 refills | Status: AC

## 2023-09-13 NOTE — Patient Instructions (Signed)
 Tonya Arnold, thank you for joining [name], PA-C for today's virtual visit.  While this provider is not your primary care provider (PCP), if your PCP is located in our provider database this encounter information will be shared with them immediately following your visit.   A [facility] MyChart account gives you access to today's visit and all your visits, tests, and labs performed at [facility]  click here if you don't have a [facility] MyChart account or go to [email or web address]  Consent: (Patient) Tonya Arnold provided verbal consent for this virtual visit at the beginning of the encounter.  Current Medications:  Current Outpatient Medications:    azithromycin  (ZITHROMAX ) 250 MG tablet, Take 2 tablets on day 1, then 1 tablet daily on days 2 through 5, Disp: 6 tablet, Rfl: 0   brompheniramine-pseudoephedrine -DM 30-2-10 MG/5ML syrup, Take 5 mLs by mouth 4 (four) times daily as needed., Disp: 120 mL, Rfl: 0   fluticasone  (FLONASE ) 50 MCG/ACT nasal spray, Place 2 sprays into both nostrils daily., Disp: 16 g, Rfl: 0   ibuprofen  (ADVIL ) 200 MG tablet, Take 440 mg by mouth every 6 (six) hours as needed for fever, headache or mild pain., Disp: , Rfl:    KARIVA  0.15-0.02/0.01 MG (21/5) tablet, Take 1 tablet by mouth daily., Disp: , Rfl:    Multiple Vitamin (MULTIVITAMIN WITH MINERALS) TABS tablet, Take 1 tablet by mouth daily., Disp: , Rfl:    naproxen sodium (ALEVE) 220 MG tablet, Take 440 mg by mouth 2 (two) times daily as needed (headache/pain)., Disp: , Rfl:    ondansetron  (ZOFRAN ) 4 MG tablet, Take 1 tablet (4 mg total) by mouth every 8 (eight) hours as needed for nausea or vomiting., Disp: 21 tablet, Rfl: 0   sulfamethoxazole -trimethoprim  (BACTRIM  DS) 800-160 MG tablet, Take 1 tablet by mouth 2 (two) times daily., Disp: 14 tablet, Rfl: 0   SUMAtriptan  (IMITREX ) 50 MG tablet, Take 1-2 tablets (50-100 mg total) by mouth once for 1 dose.  May repeat in 2 hours if headache persists or recurs. Do not take more than 2 tablets in 24 hr period, Disp: 10 tablet, Rfl: 0   VITAMIN D PO, Take 1 capsule by mouth daily., Disp: , Rfl:    Medications ordered in this encounter:  Meds ordered this encounter  Medications   fluticasone  (FLONASE ) 50 MCG/ACT nasal spray    Sig: Place 2 sprays into both nostrils daily.    Dispense:  16 g    Refill:  0    Supervising Provider:   LAMPTEY, PHILIP O [id number]   brompheniramine-pseudoephedrine -DM 30-2-10 MG/5ML syrup    Sig: Take 5 mLs by mouth 4 (four) times daily as needed.    Dispense:  120 mL    Refill:  0    Supervising Provider:   BLAISE ALEENE KIDD [[id number]]   azithromycin  (ZITHROMAX ) 250 MG tablet    Sig: Take 2 tablets on day 1, then 1 tablet daily on days 2 through 5    Dispense:  6 tablet    Refill:  0    Supervising Provider:   LAMPTEY, PHILIP O [phone]     *If you need refills on other medications prior to your next appointment, please contact your pharmacy*  Follow-Up: Call back or seek an in-person evaluation if the symptoms worsen or if the condition fails to improve as anticipated.  [facility] [phone]  Other Instructions Upper Respiratory Infection, Adult An upper respiratory infection (URI) is a common  viral infection of the nose, throat, and upper air passages that lead to the lungs. The most common type of URI is the common cold. URIs usually get better on their own, without medical treatment. What are the causes? A URI is caused by a virus. You may catch a virus by: Breathing in droplets from an infected person's cough or sneeze. Touching something that has been exposed to the virus (is contaminated) and then touching your mouth, nose, or eyes. What increases the risk? You are more likely to get a URI if: You are very young or very old. You have close contact with others, such as at work, school, or a health care facility. You smoke. You  have long-term (chronic) heart or lung disease. You have a weakened disease-fighting system (immune system). You have nasal allergies or asthma. You are experiencing a lot of stress. You have poor nutrition. What are the signs or symptoms? A URI usually involves some of the following symptoms: Runny or stuffy (congested) nose. Cough. Sneezing. Sore throat. Headache. Fatigue. Fever. Loss of appetite. Pain in your forehead, behind your eyes, and over your cheekbones (sinus pain). Muscle aches. Redness or irritation of the eyes. Pressure in the ears or face. How is this diagnosed? This condition may be diagnosed based on your medical history and symptoms, and a physical exam. Your health care provider may use a swab to take a mucus sample from your nose (nasal swab). This sample can be tested to determine what virus is causing the illness. How is this treated? URIs usually get better on their own within 7-10 days. Medicines cannot cure URIs, but your health care provider may recommend certain medicines to help relieve symptoms, such as: Over-the-counter cold medicines. Cough suppressants. Coughing is a type of defense against infection that helps to clear the respiratory system, so take these medicines only as recommended by your health care provider. Fever-reducing medicines. Follow these instructions at home: Activity Rest as needed. If you have a fever, stay home from work or school until your fever is gone or until your health care provider says your URI cannot spread to other people (is no longer contagious). Your health care provider may have you wear a face mask to prevent your infection from spreading. Relieving symptoms Gargle with a mixture of salt and water 3-4 times a day or as needed. To make salt water, completely dissolve -1 tsp (3-6 g) of salt in 1 cup (237 mL) of warm water. Use a cool-mist humidifier to add moisture to the air. This can help you breathe more  easily. Eating and drinking  Drink enough fluid to keep your urine pale yellow. Eat soups and other clear broths. General instructions  Take over-the-counter and prescription medicines only as told by your health care provider. These include cold medicines, fever reducers, and cough suppressants. Do not use any products that contain nicotine or tobacco. These products include cigarettes, chewing tobacco, and vaping devices, such as e-cigarettes. If you need help quitting, ask your health care provider. Stay away from secondhand smoke. Stay up to date on all immunizations, including the yearly (annual) flu vaccine. Keep all follow-up visits. This is important. How to prevent the spread of infection to others URIs can be contagious. To prevent the infection from spreading: Wash your hands with soap and water for at least 20 seconds. If soap and water are not available, use hand sanitizer. Avoid touching your mouth, face, eyes, or nose. Cough or sneeze into a tissue or  your sleeve or elbow instead of into your hand or into the air.  Contact a health care provider if: You are getting worse instead of better. You have a fever or chills. Your mucus is brown or red. You have yellow or brown discharge coming from your nose. You have pain in your face, especially when you bend forward. You have swollen neck glands. You have pain while swallowing. You have white areas in the back of your throat. Get help right away if: You have shortness of breath that gets worse. You have severe or persistent: Headache. Ear pain. Sinus pain. Chest pain. You have chronic lung disease along with any of the following: Making high-pitched whistling sounds when you breathe, most often when you breathe out (wheezing). Prolonged cough (more than 14 days). Coughing up blood. A change in your usual mucus. You have a stiff neck. You have changes in your: Vision. Hearing. Thinking. Mood. These symptoms may  be an emergency. Get help right away. Call 911. Do not wait to see if the symptoms will go away. Do not drive yourself to the hospital. Summary An upper respiratory infection (URI) is a common infection of the nose, throat, and upper air passages that lead to the lungs. A URI is caused by a virus. URIs usually get better on their own within 7-10 days. Medicines cannot cure URIs, but your health care provider may recommend certain medicines to help relieve symptoms. This information is not intended to replace advice given to you by your health care provider. Make sure you discuss any questions you have with your health care provider. Document Revised: 03/19/2021 Document Reviewed: 03/19/2021 Elsevier Patient Education  2024 [employer]    If you have been instructed to have an in-person evaluation today at a local Urgent Care facility, please use the link below. It will take you to a list of all of our available [facility], including address, phone number and hours of operation. Please do not delay care.  [facility]  If you or a family member do not have a primary care provider, use the link below to schedule a visit and establish care. When you choose a [facility] primary care physician or advanced practice provider, you gain a long-term partner in health. Find a Primary Care Provider  Learn more about [facility]'s in-office and virtual care options: [facility] Get Care Now

## 2023-09-13 NOTE — Progress Notes (Signed)
 Virtual Visit Consent   Tonya Arnold, you are scheduled for a virtual visit with a [facility] provider today. Just as with appointments in the office, your consent must be obtained to participate. Your consent will be active for this visit and any virtual visit you may have with one of our providers in the next 365 days. If you have a MyChart account, a copy of this consent can be sent to you electronically.  As this is a virtual visit, video technology does not allow for your provider to perform a traditional examination. This may limit your provider's ability to fully assess your condition. If your provider identifies any concerns that need to be evaluated in person or the need to arrange testing (such as labs, EKG, etc.), we will make arrangements to do so. Although advances in technology are sophisticated, we cannot ensure that it will always work on either your end or our end. If the connection with a video visit is poor, the visit may have to be switched to a telephone visit. With either a video or telephone visit, we are not always able to ensure that we have a secure connection.  By engaging in this virtual visit, you consent to the provision of healthcare and authorize for your insurance to be billed (if applicable) for the services provided during this visit. Depending on your insurance coverage, you may receive a charge related to this service.  I need to obtain your verbal consent now. Are you willing to proceed with your visit today? Tonya Arnold has provided verbal consent on 09/13/2023 for a virtual visit (video or telephone). Tonya CHRISTELLA Dickinson, PA-C  Date: 09/13/2023 8:22 AM  Virtual Visit via Video Note   I, Tonya Arnold, connected with  Tonya Arnold  ([id number], 38) on 09/13/23 at  8:15 AM EST by a video-enabled telemedicine application and verified that I am speaking with the correct person using two  identifiers.  Location: Patient: Virtual Visit Location Patient: Home Provider: Virtual Visit Location Provider: Home Office   I discussed the limitations of evaluation and management by telemedicine and the availability of in person appointments. The patient expressed understanding and agreed to proceed.    History of Present Illness: Tonya Arnold is a 38 y.o. who identifies as a female who was assigned female at birth, and is being seen today for URI symptoms.  HPI: URI  This is a new problem. The current episode started yesterday. The problem has been gradually worsening. There has been no fever. Associated symptoms include congestion, coughing, diarrhea, headaches and sinus pain. Pertinent negatives include no ear pain, nausea, plugged ear sensation, rhinorrhea, sore throat, vomiting or wheezing. Associated symptoms comments: myalgias. She has tried nothing for the symptoms. The treatment provided no relief.     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    Allergies:  Allergies  Allergen Reactions   Doxycycline  Hyclate Other (See Comments)   Amoxicillin Rash and Other (See Comments)    Pt states she is not allergic to penicillin or any other antibiotic   Medications:  Current Outpatient Medications:    azithromycin  (ZITHROMAX ) 250 MG tablet, Take 2 tablets on day 1, then 1 tablet daily on days 2 through 5, Disp: 6 tablet, Rfl: 0   brompheniramine-pseudoephedrine -DM 30-2-10 MG/5ML syrup, Take 5 mLs by mouth 4 (  four) times daily as needed., Disp: 120 mL, Rfl: 0   fluticasone  (FLONASE ) 50 MCG/ACT nasal spray, Place 2 sprays into both nostrils daily., Disp: 16 g, Rfl: 0   ibuprofen  (ADVIL ) 200 MG tablet, Take 440 mg by mouth every 6 (six) hours as needed for fever, headache or mild pain., Disp: , Rfl:    KARIVA   0.15-0.02/0.01 MG (21/5) tablet, Take 1 tablet by mouth daily., Disp: , Rfl:    Multiple Vitamin (MULTIVITAMIN WITH MINERALS) TABS tablet, Take 1 tablet by mouth daily., Disp: , Rfl:    naproxen sodium (ALEVE) 220 MG tablet, Take 440 mg by mouth 2 (two) times daily as needed (headache/pain)., Disp: , Rfl:    ondansetron  (ZOFRAN ) 4 MG tablet, Take 1 tablet (4 mg total) by mouth every 8 (eight) hours as needed for nausea or vomiting., Disp: 21 tablet, Rfl: 0   sulfamethoxazole -trimethoprim  (BACTRIM  DS) 800-160 MG tablet, Take 1 tablet by mouth 2 (two) times daily., Disp: 14 tablet, Rfl: 0   SUMAtriptan  (IMITREX ) 50 MG tablet, Take 1-2 tablets (50-100 mg total) by mouth once for 1 dose. May repeat in 2 hours if headache persists or recurs. Do not take more than 2 tablets in 24 hr period, Disp: 10 tablet, Rfl: 0   VITAMIN D PO, Take 1 capsule by mouth daily., Disp: , Rfl:    Observations/Objective: Patient is well-developed, well-nourished in no acute distress.  Resting comfortably at home.  Head is normocephalic, atraumatic.  No labored breathing.  Speech is clear and coherent with logical content.  Patient is alert and oriented at baseline.    Assessment and Plan: 1. Acute viral sinusitis (Primary) - fluticasone  (FLONASE ) 50 MCG/ACT nasal spray; Place 2 sprays into both nostrils daily.  Dispense: 16 g; Refill: 0 - brompheniramine-pseudoephedrine -DM 30-2-10 MG/5ML syrup; Take 5 mLs by mouth 4 (four) times daily as needed.  Dispense: 120 mL; Refill: 0 - azithromycin  (ZITHROMAX ) 250 MG tablet; Take 2 tablets on day 1, then 1 tablet daily on days 2 through 5  Dispense: 6 tablet; Refill: 0  - Suspect Viral URI/Sinusitis - Add flonase  and Bromfed DM - Continue Tylenol  as needed - Added Zpack, but advised to not start for a few days to see if symptoms improve - Steam and Humidifier - Rest - Push fluids - Seek in person evaluation if not improving or if symptoms are worsening  Follow Up  Instructions: I discussed the assessment and treatment plan with the patient. The patient was provided an opportunity to ask questions and all were answered. The patient agreed with the plan and demonstrated an understanding of the instructions.  A copy of instructions were sent to the patient via MyChart unless otherwise noted below.    The patient was advised to call back or seek an in-person evaluation if the symptoms worsen or if the condition fails to improve as anticipated.    Tonya CHRISTELLA Dickinson, PA-C

## 2023-10-21 DIAGNOSIS — L718 Other rosacea: Secondary | ICD-10-CM | POA: Diagnosis not present

## 2023-10-21 DIAGNOSIS — L503 Dermatographic urticaria: Secondary | ICD-10-CM | POA: Diagnosis not present

## 2023-10-24 ENCOUNTER — Telehealth: Payer: 59 | Admitting: Family

## 2023-10-24 DIAGNOSIS — R6889 Other general symptoms and signs: Secondary | ICD-10-CM | POA: Diagnosis not present

## 2023-10-24 DIAGNOSIS — Z20828 Contact with and (suspected) exposure to other viral communicable diseases: Secondary | ICD-10-CM

## 2023-10-24 MED ORDER — XOFLUZA (80 MG DOSE) 1 X 80 MG PO TBPK: 80.0000 mg | ORAL_TABLET | Freq: Once | ORAL | 0 refills | Status: AC

## 2023-10-24 MED ORDER — BENZONATATE 100 MG PO CAPS: 100.0000 mg | ORAL_CAPSULE | Freq: Three times a day (TID) | ORAL | 0 refills | Status: DC | PRN

## 2023-10-24 MED ORDER — PROMETHAZINE-DM 6.25-15 MG/5ML PO SYRP: 5.0000 mL | ORAL_SOLUTION | Freq: Three times a day (TID) | ORAL | 0 refills | Status: DC | PRN

## 2023-10-24 NOTE — Progress Notes (Signed)
 Virtual Visit Consent   Tonya Arnold, you are scheduled for a virtual visit with a [facility] provider today. Just as with appointments in the office, your consent must be obtained to participate. Your consent will be active for this visit and any virtual visit you may have with one of our providers in the next 365 days. If you have a MyChart account, a copy of this consent can be sent to you electronically.  As this is a virtual visit, video technology does not allow for your provider to perform a traditional examination. This may limit your provider's ability to fully assess your condition. If your provider identifies any concerns that need to be evaluated in person or the need to arrange testing (such as labs, EKG, etc.), we will make arrangements to do so. Although advances in technology are sophisticated, we cannot ensure that it will always work on either your end or our end. If the connection with a video visit is poor, the visit may have to be switched to a telephone visit. With either a video or telephone visit, we are not always able to ensure that we have a secure connection.  By engaging in this virtual visit, you consent to the provision of healthcare and authorize for your insurance to be billed (if applicable) for the services provided during this visit. Depending on your insurance coverage, you may receive a charge related to this service.  I need to obtain your verbal consent now. Are you willing to proceed with your visit today? Tonya Arnold has provided verbal consent on 10/24/2023 for a virtual visit (video or telephone). [name], FNP  Date: 10/24/2023 1:17 PM   Virtual Visit via Video Note   I, [name], connected with  Tonya Arnold  ([id number], 09-18-1985) on 10/24/23 at  2:00 PM EST by a video-enabled telemedicine application and verified that I am speaking with the correct person using two identifiers.  Location: Patient:  Virtual Visit Location Patient: Home Provider: Virtual Visit Location Provider: Home Office   I discussed the limitations of evaluation and management by telemedicine and the availability of in person appointments. The patient expressed understanding and agreed to proceed.    History of Present Illness: Tonya Arnold is a 38 y.o. who identifies as a female who was assigned female at birth, and is being seen today for flu like symptoms that started last night. Was exposed to flu.   HPI: Influenza This is a new problem. The current episode started yesterday. The problem occurs constantly. The problem has been gradually worsening. Associated symptoms include chills, congestion, coughing, fatigue, a fever, headaches, joint swelling, myalgias, nausea and a sore throat. Pertinent negatives include no vomiting. She has tried rest and NSAIDs for the symptoms. The treatment provided mild relief.    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    Allergies:  Allergies  Allergen Reactions   Doxycycline Hyclate Other (See Comments)   Amoxicillin Rash and Other (See Comments)    Pt states she is not allergic to penicillin or any other antibiotic   Medications:  Current Outpatient Medications:    Baloxavir Marboxil,80 MG Dose, (XOFLUZA, 80 MG DOSE,) 1 x 80 MG TBPK, Take 80 mg by mouth once for 1 dose., Disp: 1 each, Rfl: 0   benzonatate (TESSALON PERLES) 100 MG capsule, Take  1 capsule (100 mg total) by mouth 3 (three) times daily as needed., Disp: 20 capsule, Rfl: 0   promethazine-dextromethorphan (PROMETHAZINE-DM) 6.25-15 MG/5ML syrup, Take 5 mLs by mouth 3 (three) times daily as needed for cough., Disp: 118 mL, Rfl: 0   brompheniramine-pseudoephedrine-DM 30-2-10 MG/5ML syrup, Take 5 mLs by mouth 4 (four) times daily as needed., Disp: 120 mL,  Rfl: 0   fluticasone (FLONASE) 50 MCG/ACT nasal spray, Place 2 sprays into both nostrils daily., Disp: 16 g, Rfl: 0   ibuprofen (ADVIL) 200 MG tablet, Take 440 mg by mouth every 6 (six) hours as needed for fever, headache or mild pain., Disp: , Rfl:    KARIVA 0.15-0.02/0.01 MG (21/5) tablet, Take 1 tablet by mouth daily., Disp: , Rfl:    Multiple Vitamin (MULTIVITAMIN WITH MINERALS) TABS tablet, Take 1 tablet by mouth daily., Disp: , Rfl:    naproxen sodium (ALEVE) 220 MG tablet, Take 440 mg by mouth 2 (two) times daily as needed (headache/pain)., Disp: , Rfl:    ondansetron (ZOFRAN) 4 MG tablet, Take 1 tablet (4 mg total) by mouth every 8 (eight) hours as needed for nausea or vomiting., Disp: 21 tablet, Rfl: 0   sulfamethoxazole-trimethoprim (BACTRIM DS) 800-160 MG tablet, Take 1 tablet by mouth 2 (two) times daily., Disp: 14 tablet, Rfl: 0   SUMAtriptan (IMITREX) 50 MG tablet, Take 1-2 tablets (50-100 mg total) by mouth once for 1 dose. May repeat in 2 hours if headache persists or recurs. Do not take more than 2 tablets in 24 hr period, Disp: 10 tablet, Rfl: 0   VITAMIN D PO, Take 1 capsule by mouth daily., Disp: , Rfl:   Observations/Objective: Patient is well-developed, well-nourished in no acute distress.  Resting comfortably  at home.  Head is normocephalic, atraumatic.  No labored breathing.  Speech is clear and coherent with logical content.  Patient is alert and oriented at baseline.  Nasal congestion   Assessment and Plan: 1. Flu-like symptoms (Primary) - Baloxavir Marboxil,80 MG Dose, (XOFLUZA, 80 MG DOSE,) 1 x 80 MG TBPK; Take 80 mg by mouth once for 1 dose.  Dispense: 1 each; Refill: 0 - benzonatate (TESSALON PERLES) 100 MG capsule; Take 1 capsule (100 mg total) by mouth 3 (three) times daily as needed.  Dispense: 20 capsule; Refill: 0 - promethazine-dextromethorphan (PROMETHAZINE-DM) 6.25-15 MG/5ML syrup; Take 5 mLs by mouth 3 (three) times daily as needed for cough.  Dispense:  118 mL; Refill: 0  2. Exposure to the flu - Baloxavir Marboxil,80 MG Dose, (XOFLUZA, 80 MG DOSE,) 1 x 80 MG TBPK; Take 80 mg by mouth once for 1 dose.  Dispense: 1 each; Refill: 0 - benzonatate (TESSALON PERLES) 100 MG capsule; Take 1 capsule (100 mg total) by mouth 3 (three) times daily as needed.  Dispense: 20 capsule; Refill: 0 - promethazine-dextromethorphan (PROMETHAZINE-DM) 6.25-15 MG/5ML syrup; Take 5 mLs by mouth 3 (three) times daily as needed for cough.  Dispense: 118 mL; Refill: 0  Rest Force fluids  Droplet precautions  Tylenol as needed Mucinex  Follow up if symptoms worsen or do not improve   Follow Up Instructions: I discussed the assessment and treatment plan with the patient. The patient was provided an opportunity to ask questions and all were answered. The patient agreed with the plan and demonstrated an understanding of the instructions.  A copy of instructions were sent to the patient via MyChart unless otherwise noted below.     The patient was advised to call back or  seek an in-person evaluation if the symptoms worsen or if the condition fails to improve as anticipated.    [name], FNP

## 2023-11-09 ENCOUNTER — Telehealth

## 2023-11-09 DIAGNOSIS — R058 Other specified cough: Secondary | ICD-10-CM | POA: Diagnosis not present

## 2023-11-09 MED ORDER — PREDNISONE 10 MG PO TABS: ORAL_TABLET | ORAL | 0 refills | Status: DC

## 2023-11-09 MED ORDER — PSEUDOEPH-BROMPHEN-DM 30-2-10 MG/5ML PO SYRP: 5.0000 mL | ORAL_SOLUTION | Freq: Four times a day (QID) | ORAL | 0 refills | Status: DC | PRN

## 2023-11-09 MED ORDER — BENZONATATE 100 MG PO CAPS
100.0000 mg | ORAL_CAPSULE | Freq: Three times a day (TID) | ORAL | 0 refills | Status: DC | PRN
Start: 2023-11-09 — End: 2024-02-26

## 2023-11-09 NOTE — Patient Instructions (Signed)
 Estrella Myrtle, thank you for joining Margaretann Loveless, PA-C for today's virtual visit.  While this provider is not your primary care provider (PCP), if your PCP is located in our provider database this encounter information will be shared with them immediately following your visit.   A [facility] MyChart account gives you access to today's visit and all your visits, tests, and labs performed at [facility] " click here if you don't have a [facility] MyChart account or go to [email or web address]  Consent: (Patient) Tonya Arnold provided verbal consent for this virtual visit at the beginning of the encounter.  Current Medications:  Current Outpatient Medications:    benzonatate (TESSALON) 100 MG capsule, Take 1-2 capsules (100-200 mg total) by mouth 3 (three) times daily as needed., Disp: 30 capsule, Rfl: 0   brompheniramine-pseudoephedrine-DM 30-2-10 MG/5ML syrup, Take 5 mLs by mouth 4 (four) times daily as needed., Disp: 120 mL, Rfl: 0   predniSONE (DELTASONE) 10 MG tablet, Days 1-4 take 4 tablets (40 mg) daily  Days 5-8 take 3 tablets (30 mg) daily, Days 9-11 take 2 tablets (20 mg) daily, Days 12-14 take 1 tablet (10 mg) daily., Disp: 37 tablet, Rfl: 0   fluticasone (FLONASE) 50 MCG/ACT nasal spray, Place 2 sprays into both nostrils daily., Disp: 16 g, Rfl: 0   ibuprofen (ADVIL) 200 MG tablet, Take 440 mg by mouth every 6 (six) hours as needed for fever, headache or mild pain., Disp: , Rfl:    KARIVA 0.15-0.02/0.01 MG (21/5) tablet, Take 1 tablet by mouth daily., Disp: , Rfl:    Multiple Vitamin (MULTIVITAMIN WITH MINERALS) TABS tablet, Take 1 tablet by mouth daily., Disp: , Rfl:    naproxen sodium (ALEVE) 220 MG tablet, Take 440 mg by mouth 2 (two) times daily as needed (headache/pain)., Disp: , Rfl:    ondansetron (ZOFRAN) 4 MG tablet, Take 1 tablet (4 mg total) by mouth every 8 (eight) hours as needed for nausea or vomiting., Disp: 21 tablet, Rfl:  0   sulfamethoxazole-trimethoprim (BACTRIM DS) 800-160 MG tablet, Take 1 tablet by mouth 2 (two) times daily., Disp: 14 tablet, Rfl: 0   SUMAtriptan (IMITREX) 50 MG tablet, Take 1-2 tablets (50-100 mg total) by mouth once for 1 dose. May repeat in 2 hours if headache persists or recurs. Do not take more than 2 tablets in 24 hr period, Disp: 10 tablet, Rfl: 0   VITAMIN D PO, Take 1 capsule by mouth daily., Disp: , Rfl:    Medications ordered in this encounter:  Meds ordered this encounter  Medications   predniSONE (DELTASONE) 10 MG tablet    Sig: Days 1-4 take 4 tablets (40 mg) daily  Days 5-8 take 3 tablets (30 mg) daily, Days 9-11 take 2 tablets (20 mg) daily, Days 12-14 take 1 tablet (10 mg) daily.    Dispense:  37 tablet    Refill:  0    Supervising Provider:   Merrilee Jansky [[id number]]   brompheniramine-pseudoephedrine-DM 30-2-10 MG/5ML syrup    Sig: Take 5 mLs by mouth 4 (four) times daily as needed.    Dispense:  120 mL    Refill:  0    Supervising Provider:   Merrilee Jansky [[id number]]   benzonatate (TESSALON) 100 MG capsule    Sig: Take 1-2 capsules (100-200 mg total) by mouth 3 (three) times daily as needed.    Dispense:  30 capsule    Refill:  0    Supervising Provider:  Merrilee Jansky [[id number]]     *If you need refills on other medications prior to your next appointment, please contact your pharmacy*  Follow-Up: Call back or seek an in-person evaluation if the symptoms worsen or if the condition fails to improve as anticipated.  [facility] [phone]  Other Instructions  It sounds like you may have post-viral cough syndrome. This is a chronic cough after a moderate upper respiratory infection that is caused from residual inflammation in the airway from the previous infection.    If you have been instructed to have an in-person evaluation today at a local Urgent Care facility, please use the link below. It will take you to a list of all of our  available [facility], including address, phone number and hours of operation. Please do not delay care.  [facility]  If you or a family member do not have a primary care provider, use the link below to schedule a visit and establish care. When you choose a [facility] primary care physician or advanced practice provider, you gain a long-term partner in health. Find a Primary Care Provider  Learn more about [facility] in-office and virtual care options: [facility] - Get Care Now

## 2023-11-09 NOTE — Progress Notes (Signed)
 Virtual Visit Consent   Tonya Arnold, you are scheduled for a virtual visit with a [facility] provider today. Just as with appointments in the office, your consent must be obtained to participate. Your consent will be active for this visit and any virtual visit you may have with one of our providers in the next 365 days. If you have a MyChart account, a copy of this consent can be sent to you electronically.  As this is a virtual visit, video technology does not allow for your provider to perform a traditional examination. This may limit your provider's ability to fully assess your condition. If your provider identifies any concerns that need to be evaluated in person or the need to arrange testing (such as labs, EKG, etc.), we will make arrangements to do so. Although advances in technology are sophisticated, we cannot ensure that it will always work on either your end or our end. If the connection with a video visit is poor, the visit may have to be switched to a telephone visit. With either a video or telephone visit, we are not always able to ensure that we have a secure connection.  By engaging in this virtual visit, you consent to the provision of healthcare and authorize for your insurance to be billed (if applicable) for the services provided during this visit. Depending on your insurance coverage, you may receive a charge related to this service.  I need to obtain your verbal consent now. Are you willing to proceed with your visit today? Tonya Arnold has provided verbal consent on 11/09/2023 for a virtual visit (video or telephone). Tonya Loveless, PA-C  Date: 11/09/2023 8:30 AM   Virtual Visit via Video Note   I, Tonya Arnold, connected with  Tonya Arnold  ([id number], 1986/03/26) on 11/09/23 at  8:30 AM EDT by a video-enabled telemedicine application and verified that I am speaking with the correct person using two  identifiers.  Location: Patient: Virtual Visit Location Patient: Home Provider: Virtual Visit Location Provider: Home Office   I discussed the limitations of evaluation and management by telemedicine and the availability of in person appointments. The patient expressed understanding and agreed to proceed.    History of Present Illness: Tonya Arnold is a 38 y.o. who identifies as a female who was assigned female at birth, and is being seen today for URI symptoms.  HPI: URI  This is a recurrent problem. Episode onset: Seen 09/13/23 for similar sinus symptoms, then seen virtually on 10/24/23 for Influenza; Reports symptoms have improved from Flu but feels congestion in her head and chest. The problem has been gradually improving. There has been no fever. Associated symptoms include congestion, coughing, headaches and wheezing. Pertinent negatives include no ear pain, nausea, plugged ear sensation, rhinorrhea, sinus pain or sore throat. The treatment provided no relief.     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    Allergies:  Allergies  Allergen Reactions   Doxycycline Hyclate Other (See Comments)   Amoxicillin Rash and Other (See Comments)    Pt states she is not allergic to penicillin or any other antibiotic   Medications:  Current Outpatient Medications:    benzonatate (TESSALON PERLES) 100 MG capsule, Take 1 capsule (100 mg total) by mouth 3 (three) times daily as needed., Disp: 20 capsule, Rfl: 0  brompheniramine-pseudoephedrine-DM 30-2-10 MG/5ML syrup, Take 5 mLs by mouth 4 (four) times daily as needed., Disp: 120 mL, Rfl: 0   fluticasone (FLONASE) 50 MCG/ACT nasal spray, Place 2 sprays into both nostrils daily., Disp: 16 g, Rfl: 0   ibuprofen (ADVIL) 200 MG tablet, Take 440 mg by mouth every 6 (six) hours as needed  for fever, headache or mild pain., Disp: , Rfl:    KARIVA 0.15-0.02/0.01 MG (21/5) tablet, Take 1 tablet by mouth daily., Disp: , Rfl:    Multiple Vitamin (MULTIVITAMIN WITH MINERALS) TABS tablet, Take 1 tablet by mouth daily., Disp: , Rfl:    naproxen sodium (ALEVE) 220 MG tablet, Take 440 mg by mouth 2 (two) times daily as needed (headache/pain)., Disp: , Rfl:    ondansetron (ZOFRAN) 4 MG tablet, Take 1 tablet (4 mg total) by mouth every 8 (eight) hours as needed for nausea or vomiting., Disp: 21 tablet, Rfl: 0   promethazine-dextromethorphan (PROMETHAZINE-DM) 6.25-15 MG/5ML syrup, Take 5 mLs by mouth 3 (three) times daily as needed for cough., Disp: 118 mL, Rfl: 0   sulfamethoxazole-trimethoprim (BACTRIM DS) 800-160 MG tablet, Take 1 tablet by mouth 2 (two) times daily., Disp: 14 tablet, Rfl: 0   SUMAtriptan (IMITREX) 50 MG tablet, Take 1-2 tablets (50-100 mg total) by mouth once for 1 dose. May repeat in 2 hours if headache persists or recurs. Do not take more than 2 tablets in 24 hr period, Disp: 10 tablet, Rfl: 0   VITAMIN D PO, Take 1 capsule by mouth daily., Disp: , Rfl:   Observations/Objective: Patient is well-developed, well-nourished in no acute distress.  Resting comfortably at home.  Head is normocephalic, atraumatic.  No labored breathing.  Speech is clear and coherent with logical content.  Patient is alert and oriented at baseline.  Dry cough heard once with throat clearing  Assessment and Plan: There are no diagnoses linked to this encounter. - Suspect post viral cough syndrome from recent influenza - Add Prednisone - Refilled Bromfed DM and Tessalon perles - Push fluids - Steam and Humidifier can help - Saline nasal rinses - Flonase for post nasal drainage - Seek in person evaluation if worsening or fails to resolve  Follow Up Instructions: I discussed the assessment and treatment plan with the patient. The patient was provided an opportunity to ask questions and all  were answered. The patient agreed with the plan and demonstrated an understanding of the instructions.  A copy of instructions were sent to the patient via MyChart unless otherwise noted below.    The patient was advised to call back or seek an in-person evaluation if the symptoms worsen or if the condition fails to improve as anticipated.    Tonya Loveless, PA-C

## 2023-12-23 ENCOUNTER — Telehealth: Payer: Self-pay | Admitting: Emergency Medicine

## 2023-12-23 NOTE — Telephone Encounter (Signed)
 Copied from CRM [id number]. Topic: Complaint (DO NOT CONVERT) - Billing/Coding >> Dec 23, 2023 12:14 PM Aisha D wrote: DOS: December 31,2024 Details of complaint: Patient stated that back in December they had a visit with Vicci Graff,. Patient stated that the insurance company is stating that they see a bill owed status but it doesn't have an amount. Patient would like to have the coding of the appointment corrected so they are not billed. How would the patient like to see this issue resolved? Fix the coding and call back with update at [phone]  ---  I'm not seeing an outstanding balance, can you please take a look for me?

## 2024-01-10 DIAGNOSIS — M25571 Pain in right ankle and joints of right foot: Secondary | ICD-10-CM | POA: Diagnosis not present

## 2024-01-10 DIAGNOSIS — M25572 Pain in left ankle and joints of left foot: Secondary | ICD-10-CM | POA: Diagnosis not present

## 2024-01-10 DIAGNOSIS — M25371 Other instability, right ankle: Secondary | ICD-10-CM | POA: Diagnosis not present

## 2024-02-26 ENCOUNTER — Telehealth: Admitting: Nurse Practitioner

## 2024-02-26 ENCOUNTER — Telehealth

## 2024-02-26 DIAGNOSIS — H109 Unspecified conjunctivitis: Secondary | ICD-10-CM | POA: Diagnosis not present

## 2024-02-26 MED ORDER — POLYMYXIN B-TRIMETHOPRIM 10000-0.1 UNIT/ML-% OP SOLN: 1.0000 [drp] | OPHTHALMIC | 0 refills | Status: AC

## 2024-02-26 NOTE — Progress Notes (Signed)
 I have spent 5 minutes in review of e-visit questionnaire, review and updating patient chart, medical decision making and response to patient.   Claiborne Rigg, NP

## 2024-02-26 NOTE — Progress Notes (Signed)
 E-Visit for [employer]   We are sorry that you are not feeling well.  Here is how we plan to help!  Based on what you have shared with me it looks like you have conjunctivitis.  Conjunctivitis is a common inflammatory or infectious condition of the eye that is often referred to as pink eye.  In most cases it is contagious (viral or bacterial). However, not all conjunctivitis requires antibiotics (ex. Allergic).  We have made appropriate suggestions for you based upon your presentation.  I have prescribed Polytrim Ophthalmic drops 1-2 drops 4 times a day times 5 days Unfortunately we are not able to refill the Kariva  for you.   Pink eye can be highly contagious.  It is typically spread through direct contact with secretions, or contaminated objects or surfaces that one may have touched.  Strict handwashing is suggested with soap and water is urged.  If not available, use alcohol based had sanitizer.  Avoid unnecessary touching of the eye.  If you wear contact lenses, you will need to refrain from wearing them until you see no white discharge from the eye for at least 24 hours after being on medication.  You should see symptom improvement in 1-2 days after starting the medication regimen.  Call us  if symptoms are not improved in 1-2 days.  Home Care: Wash your hands often! Do not wear your contacts until you complete your treatment plan. Avoid sharing towels, bed linen, personal items with a person who has pink eye. See attention for anyone in your home with similar symptoms.  Get Help Right Away If: Your symptoms do not improve. You develop blurred or loss of vision. Your symptoms worsen (increased discharge, pain or redness)   Thank you for choosing an e-visit.  Your e-visit answers were reviewed by a board certified advanced clinical practitioner to complete your personal care plan. Depending upon the condition, your plan could have included both over the counter or prescription  medications.  Please review your pharmacy choice. Make sure the pharmacy is open so you can pick up prescription now. If there is a problem, you may contact your provider through [employer] and have the prescription routed to another pharmacy.  Your safety is important to us . If you have drug allergies check your prescription carefully.   For the next 24 hours you can use MyChart to ask questions about today's visit, request a non-urgent call back, or ask for a work or school excuse. You will get an email in the next two days asking about your experience. I hope that your e-visit has been valuable and will speed your recovery.

## 2024-04-21 DIAGNOSIS — Z01419 Encounter for gynecological examination (general) (routine) without abnormal findings: Secondary | ICD-10-CM | POA: Diagnosis not present

## 2024-04-21 DIAGNOSIS — Z3041 Encounter for surveillance of contraceptive pills: Secondary | ICD-10-CM | POA: Diagnosis not present

## 2024-04-21 DIAGNOSIS — Z124 Encounter for screening for malignant neoplasm of cervix: Secondary | ICD-10-CM | POA: Diagnosis not present

## 2024-04-21 DIAGNOSIS — R232 Flushing: Secondary | ICD-10-CM | POA: Diagnosis not present

## 2024-06-28 DIAGNOSIS — Z3009 Encounter for other general counseling and advice on contraception: Secondary | ICD-10-CM | POA: Diagnosis not present

## 2024-08-29 ENCOUNTER — Telehealth

## 2024-08-29 DIAGNOSIS — J020 Streptococcal pharyngitis: Secondary | ICD-10-CM | POA: Diagnosis not present

## 2024-08-29 MED ORDER — AZITHROMYCIN 250 MG PO TABS: ORAL_TABLET | ORAL | 0 refills | Status: AC

## 2024-08-29 NOTE — Progress Notes (Signed)
 " Virtual Visit Consent   Tonya Arnold, you are scheduled for a virtual visit with a [facility] provider today. Just as with appointments in the office, your consent must be obtained to participate. Your consent will be active for this visit and any virtual visit you may have with one of our providers in the next 365 days. If you have a MyChart account, a copy of this consent can be sent to you electronically.  As this is a virtual visit, video technology does not allow for your provider to perform a traditional examination. This may limit your provider's ability to fully assess your condition. If your provider identifies any concerns that need to be evaluated in person or the need to arrange testing (such as labs, EKG, etc.), we will make arrangements to do so. Although advances in technology are sophisticated, we cannot ensure that it will always work on either your end or our end. If the connection with a video visit is poor, the visit may have to be switched to a telephone visit. With either a video or telephone visit, we are not always able to ensure that we have a secure connection.  By engaging in this virtual visit, you consent to the provision of healthcare and authorize for your insurance to be billed (if applicable) for the services provided during this visit. Depending on your insurance coverage, you may receive a charge related to this service.  I need to obtain your verbal consent now. Are you willing to proceed with your visit today? Tonya Arnold has provided verbal consent on 08/29/2024 for a virtual visit (video or telephone). Tonya Lamp, FNP  Date: 08/29/2024 11:02 AM   Virtual Visit via Video Note   I, Tonya Arnold, connected with  Tonya Arnold  ([id number], July 26, 1986) on 08/29/2024 at 11:00 AM EST by a video-enabled telemedicine application and verified that I am speaking with the correct person using two identifiers.  Location: Patient:  Virtual Visit Location Patient: Home Provider: Virtual Visit Location Provider: Home Office   I discussed the limitations of evaluation and management by telemedicine and the availability of in person appointments. The patient expressed understanding and agreed to proceed.    History of Present Illness: Tonya Arnold is a 38 y.o. who identifies as a female who was assigned female at birth, and is being seen today for sore throat, exposed to strep throat, head congestion, fatigue, headache, no nausea no fever. Sx for 2 days worsening.   HPI: HPI  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    Allergies: Allergies[1] Medications: Current Medications[2]  Observations/Objective: Patient is well-developed, well-nourished in no acute distress.  Resting comfortably  at home.  Head is normocephalic, atraumatic.  No labored breathing.  Speech is clear and coherent with logical content.  Patient is alert and oriented at baseline.    Assessment and Plan: 1. Strep throat (Primary)  Increase fluids, humidifier at night, continue gargles, UC as needed.   Follow Up Instructions: I discussed the assessment and treatment plan with the patient. The patient was provided an opportunity to ask questions and all were answered. The patient agreed with the plan and demonstrated an understanding of the instructions.  A copy of instructions were sent to the patient via MyChart unless otherwise noted below.     The patient was advised  to call back or seek an in-person evaluation if the symptoms worsen or if the condition fails to improve as anticipated.    Abdulla Pooley, FNP     [1]  Allergies Allergen Reactions   Doxycycline  Hyclate Other (See Comments)   Amoxicillin Rash and Other (See Comments)    Pt states she is not allergic to  penicillin or any other antibiotic  [2]  Current Outpatient Medications:    azithromycin  (ZITHROMAX ) 250 MG tablet, Take 2 tablets on day 1, then 1 tablet daily on days 2 through 5, Disp: 6 tablet, Rfl: 0   fluticasone  (FLONASE ) 50 MCG/ACT nasal spray, Place 2 sprays into both nostrils daily., Disp: 16 g, Rfl: 0   ibuprofen  (ADVIL ) 200 MG tablet, Take 440 mg by mouth every 6 (six) hours as needed for fever, headache or mild pain., Disp: , Rfl:    KARIVA  0.15-0.02/0.01 MG (21/5) tablet, Take 1 tablet by mouth daily., Disp: , Rfl:    Multiple Vitamin (MULTIVITAMIN WITH MINERALS) TABS tablet, Take 1 tablet by mouth daily., Disp: , Rfl:    naproxen sodium (ALEVE) 220 MG tablet, Take 440 mg by mouth 2 (two) times daily as needed (headache/pain)., Disp: , Rfl:    trimethoprim -polymyxin b  (POLYTRIM ) ophthalmic solution, Place 1 drop into the left eye every 4 (four) hours., Disp: 10 mL, Rfl: 0   VITAMIN D PO, Take 1 capsule by mouth daily., Disp: , Rfl:   "

## 2024-08-29 NOTE — Patient Instructions (Signed)
